# Patient Record
Sex: Female | Born: 1985 | Race: White | Hispanic: No | Marital: Married | Smoking: Never smoker
Health system: Southern US, Community
[De-identification: ages and names within clinical notes are randomized; demographics above are authoritative.]

## PROBLEM LIST (undated history)

## (undated) DIAGNOSIS — Z789 Other specified health status: Secondary | ICD-10-CM

## (undated) HISTORY — PX: WISDOM TOOTH EXTRACTION: SHX21

## (undated) HISTORY — DX: Other specified health status: Z78.9

---

## 2015-03-30 ENCOUNTER — Encounter: Payer: Self-pay | Admitting: Obstetrics & Gynecology

## 2015-12-28 LAB — OB RESULTS CONSOLE HEPATITIS B SURFACE ANTIGEN: HEP B S AG: NEGATIVE

## 2015-12-28 LAB — OB RESULTS CONSOLE HIV ANTIBODY (ROUTINE TESTING): HIV: NONREACTIVE

## 2015-12-28 LAB — OB RESULTS CONSOLE RPR: RPR: NONREACTIVE

## 2015-12-28 LAB — OB RESULTS CONSOLE GC/CHLAMYDIA
Chlamydia: NEGATIVE
GC PROBE AMP, GENITAL: NEGATIVE

## 2015-12-28 LAB — OB RESULTS CONSOLE RUBELLA ANTIBODY, IGM: RUBELLA: IMMUNE

## 2016-05-05 LAB — OB RESULTS CONSOLE RPR: RPR: NONREACTIVE

## 2016-05-05 LAB — OB RESULTS CONSOLE HIV ANTIBODY (ROUTINE TESTING): HIV: NONREACTIVE

## 2016-07-12 LAB — OB RESULTS CONSOLE GBS: STREP GROUP B AG: POSITIVE

## 2016-08-16 ENCOUNTER — Inpatient Hospital Stay (HOSPITAL_COMMUNITY): Payer: PRIVATE HEALTH INSURANCE | Admitting: Anesthesiology

## 2016-08-16 ENCOUNTER — Encounter (HOSPITAL_COMMUNITY): Payer: Self-pay

## 2016-08-16 ENCOUNTER — Inpatient Hospital Stay (HOSPITAL_COMMUNITY)
Admission: AD | Admit: 2016-08-16 | Discharge: 2016-08-18 | DRG: 765 | Disposition: A | Discharge status: Home / Self Care | Payer: PRIVATE HEALTH INSURANCE | Source: Ambulatory Visit | Attending: Obstetrics and Gynecology | Admitting: Obstetrics and Gynecology

## 2016-08-16 ENCOUNTER — Encounter (HOSPITAL_COMMUNITY)
Admission: AD | Disposition: A | Discharge status: Home / Self Care | Payer: Self-pay | Source: Ambulatory Visit | Attending: Obstetrics and Gynecology

## 2016-08-16 DIAGNOSIS — Z3A41 41 weeks gestation of pregnancy: Secondary | ICD-10-CM

## 2016-08-16 DIAGNOSIS — O480 Post-term pregnancy: Principal | ICD-10-CM | POA: Diagnosis present

## 2016-08-16 DIAGNOSIS — Z98891 History of uterine scar from previous surgery: Secondary | ICD-10-CM | POA: Diagnosis not present

## 2016-08-16 DIAGNOSIS — D62 Acute posthemorrhagic anemia: Secondary | ICD-10-CM | POA: Diagnosis not present

## 2016-08-16 DIAGNOSIS — O9081 Anemia of the puerperium: Secondary | ICD-10-CM | POA: Diagnosis not present

## 2016-08-16 DIAGNOSIS — K219 Gastro-esophageal reflux disease without esophagitis: Secondary | ICD-10-CM | POA: Diagnosis present

## 2016-08-16 DIAGNOSIS — O99824 Streptococcus B carrier state complicating childbirth: Secondary | ICD-10-CM | POA: Diagnosis present

## 2016-08-16 DIAGNOSIS — O9962 Diseases of the digestive system complicating childbirth: Secondary | ICD-10-CM | POA: Diagnosis present

## 2016-08-16 DIAGNOSIS — Z6834 Body mass index (BMI) 34.0-34.9, adult: Secondary | ICD-10-CM

## 2016-08-16 DIAGNOSIS — E669 Obesity, unspecified: Secondary | ICD-10-CM | POA: Diagnosis present

## 2016-08-16 DIAGNOSIS — O99214 Obesity complicating childbirth: Secondary | ICD-10-CM | POA: Diagnosis present

## 2016-08-16 LAB — CBC
HEMATOCRIT: 35.5 % — AB (ref 36.0–46.0)
HEMOGLOBIN: 12.3 g/dL (ref 12.0–15.0)
MCH: 30.8 pg (ref 26.0–34.0)
MCHC: 34.6 g/dL (ref 30.0–36.0)
MCV: 88.8 fL (ref 78.0–100.0)
Platelets: 211 10*3/uL (ref 150–400)
RBC: 4 MIL/uL (ref 3.87–5.11)
RDW: 14.8 % (ref 11.5–15.5)
WBC: 16 10*3/uL — AB (ref 4.0–10.5)

## 2016-08-16 LAB — TYPE AND SCREEN
ABO/RH(D): O POS
Antibody Screen: NEGATIVE

## 2016-08-16 SURGERY — Surgical Case
Anesthesia: Regional

## 2016-08-16 SURGERY — Surgical Case
Anesthesia: Epidural

## 2016-08-16 MED ORDER — OXYCODONE-ACETAMINOPHEN 5-325 MG PO TABS: 1.0000 | ORAL_TABLET | ORAL | Status: DC | PRN

## 2016-08-16 MED ORDER — MEPERIDINE HCL 25 MG/ML IJ SOLN
INTRAMUSCULAR | Status: AC
  Filled 2016-08-16: qty 1

## 2016-08-16 MED ORDER — PHENYLEPHRINE HCL 10 MG/ML IJ SOLN
INTRAMUSCULAR | Status: DC | PRN
  Administered 2016-08-16: 80 ug via INTRAVENOUS
  Administered 2016-08-16: 120 ug via INTRAVENOUS
  Administered 2016-08-16 (×3): 80 ug via INTRAVENOUS
  Administered 2016-08-16: 40 ug via INTRAVENOUS
  Administered 2016-08-16: 80 ug via INTRAVENOUS
  Administered 2016-08-16: 40 ug via INTRAVENOUS

## 2016-08-16 MED ORDER — BUPIVACAINE HCL (PF) 0.25 % IJ SOLN
INTRAMUSCULAR | Status: AC
  Filled 2016-08-16: qty 10

## 2016-08-16 MED ORDER — ONDANSETRON HCL 4 MG/2ML IJ SOLN
INTRAMUSCULAR | Status: DC | PRN
  Administered 2016-08-16: 4 mg via INTRAVENOUS

## 2016-08-16 MED ORDER — LIDOCAINE HCL (PF) 1 % IJ SOLN: 30.0000 mL | INTRAMUSCULAR | Status: DC | PRN

## 2016-08-16 MED ORDER — ACETAMINOPHEN 325 MG PO TABS: 650.0000 mg | ORAL_TABLET | ORAL | Status: DC | PRN

## 2016-08-16 MED ORDER — ONDANSETRON HCL 4 MG/2ML IJ SOLN: 4.0000 mg | Freq: Four times a day (QID) | INTRAMUSCULAR | Status: DC | PRN

## 2016-08-16 MED ORDER — LIDOCAINE-EPINEPHRINE (PF) 2 %-1:200000 IJ SOLN
INTRAMUSCULAR | Status: DC | PRN
  Administered 2016-08-16: 5 mL via INTRADERMAL

## 2016-08-16 MED ORDER — OXYTOCIN 40 UNITS IN LACTATED RINGERS INFUSION - SIMPLE MED: 2.5000 [IU]/h | INTRAVENOUS | Status: DC

## 2016-08-16 MED ORDER — FENTANYL 2.5 MCG/ML BUPIVACAINE 1/10 % EPIDURAL INFUSION (WH - ANES)
INTRAMUSCULAR | Status: AC
  Filled 2016-08-16: qty 100

## 2016-08-16 MED ORDER — OXYTOCIN BOLUS FROM INFUSION: 500.0000 mL | Freq: Once | INTRAVENOUS | Status: DC

## 2016-08-16 MED ORDER — TERBUTALINE SULFATE 1 MG/ML IJ SOLN: 0.2500 mg | Freq: Once | INTRAMUSCULAR | Status: DC | PRN

## 2016-08-16 MED ORDER — PHENYLEPHRINE 40 MCG/ML (10ML) SYRINGE FOR IV PUSH (FOR BLOOD PRESSURE SUPPORT): 80.0000 ug | PREFILLED_SYRINGE | INTRAVENOUS | Status: DC | PRN

## 2016-08-16 MED ORDER — SOD CITRATE-CITRIC ACID 500-334 MG/5ML PO SOLN
30.0000 mL | ORAL | Status: DC | PRN
  Administered 2016-08-16 (×2): 30 mL via ORAL
  Filled 2016-08-16 (×2): qty 15

## 2016-08-16 MED ORDER — LACTATED RINGERS IV SOLN
INTRAVENOUS | Status: DC
  Administered 2016-08-16 (×2): via INTRAVENOUS

## 2016-08-16 MED ORDER — MEPERIDINE HCL 25 MG/ML IJ SOLN
INTRAMUSCULAR | Status: DC | PRN
  Administered 2016-08-16 (×2): 12.5 mg via INTRAVENOUS

## 2016-08-16 MED ORDER — SODIUM BICARBONATE 8.4 % IV SOLN
INTRAVENOUS | Status: DC | PRN
  Administered 2016-08-16 (×2): 5 mL via EPIDURAL

## 2016-08-16 MED ORDER — BUPIVACAINE HCL (PF) 0.25 % IJ SOLN
INTRAMUSCULAR | Status: DC | PRN
  Administered 2016-08-16: 10 mL

## 2016-08-16 MED ORDER — LACTATED RINGERS IV SOLN
INTRAVENOUS | Status: DC | PRN
  Administered 2016-08-16 (×2): via INTRAVENOUS

## 2016-08-16 MED ORDER — EPHEDRINE 5 MG/ML INJ: 10.0000 mg | INTRAVENOUS | Status: DC | PRN

## 2016-08-16 MED ORDER — FAMOTIDINE IN NACL 20-0.9 MG/50ML-% IV SOLN
20.0000 mg | Freq: Two times a day (BID) | INTRAVENOUS | Status: DC
  Administered 2016-08-16: 20 mg via INTRAVENOUS
  Filled 2016-08-16 (×2): qty 50

## 2016-08-16 MED ORDER — DIPHENHYDRAMINE HCL 50 MG/ML IJ SOLN
INTRAMUSCULAR | Status: DC | PRN
  Administered 2016-08-16: 25 mg via INTRAVENOUS

## 2016-08-16 MED ORDER — FENTANYL 2.5 MCG/ML BUPIVACAINE 1/10 % EPIDURAL INFUSION (WH - ANES)
14.0000 mL/h | INTRAMUSCULAR | Status: DC | PRN
  Administered 2016-08-16: 14 mL/h via EPIDURAL

## 2016-08-16 MED ORDER — PENICILLIN G POT IN DEXTROSE 60000 UNIT/ML IV SOLN
3.0000 10*6.[IU] | INTRAVENOUS | Status: DC
  Administered 2016-08-16: 3 10*6.[IU] via INTRAVENOUS
  Filled 2016-08-16 (×2): qty 50

## 2016-08-16 MED ORDER — DEXAMETHASONE SODIUM PHOSPHATE 4 MG/ML IJ SOLN
INTRAMUSCULAR | Status: DC | PRN
  Administered 2016-08-16: 4 mg via INTRAVENOUS

## 2016-08-16 MED ORDER — SCOPOLAMINE 1 MG/3DAYS TD PT72
MEDICATED_PATCH | TRANSDERMAL | Status: DC | PRN
  Administered 2016-08-16: 1 via TRANSDERMAL

## 2016-08-16 MED ORDER — CEFAZOLIN SODIUM-DEXTROSE 2-3 GM-% IV SOLR
INTRAVENOUS | Status: DC | PRN
  Administered 2016-08-16: 2 g via INTRAVENOUS

## 2016-08-16 MED ORDER — OXYTOCIN 10 UNIT/ML IJ SOLN
INTRAVENOUS | Status: DC | PRN
  Administered 2016-08-16: 40 [IU] via INTRAVENOUS

## 2016-08-16 MED ORDER — LACTATED RINGERS IV SOLN: 500.0000 mL | INTRAVENOUS | Status: DC | PRN

## 2016-08-16 MED ORDER — OXYTOCIN 40 UNITS IN LACTATED RINGERS INFUSION - SIMPLE MED
1.0000 m[IU]/min | INTRAVENOUS | Status: DC
  Administered 2016-08-16: 2 m[IU]/min via INTRAVENOUS
  Filled 2016-08-16: qty 1000

## 2016-08-16 MED ORDER — PENICILLIN G POTASSIUM 5000000 UNITS IJ SOLR
5.0000 10*6.[IU] | Freq: Once | INTRAMUSCULAR | Status: AC
  Administered 2016-08-16: 5 10*6.[IU] via INTRAVENOUS
  Filled 2016-08-16: qty 5

## 2016-08-16 MED ORDER — LACTATED RINGERS IV SOLN: 500.0000 mL | Freq: Once | INTRAVENOUS | Status: DC

## 2016-08-16 MED ORDER — LIDOCAINE HCL (PF) 1 % IJ SOLN
INTRAMUSCULAR | Status: DC | PRN
  Administered 2016-08-16 (×2): 7 mL via EPIDURAL

## 2016-08-16 MED ORDER — MORPHINE SULFATE (PF) 0.5 MG/ML IJ SOLN
INTRAMUSCULAR | Status: AC
  Filled 2016-08-16: qty 10

## 2016-08-16 MED ORDER — PHENYLEPHRINE 40 MCG/ML (10ML) SYRINGE FOR IV PUSH (FOR BLOOD PRESSURE SUPPORT)
PREFILLED_SYRINGE | INTRAVENOUS | Status: AC
  Filled 2016-08-16: qty 20

## 2016-08-16 MED ORDER — DIPHENHYDRAMINE HCL 50 MG/ML IJ SOLN: 12.5000 mg | INTRAMUSCULAR | Status: DC | PRN

## 2016-08-16 SURGICAL SUPPLY — 36 items
CHLORAPREP W/TINT 26ML (MISCELLANEOUS) ×3 IMPLANT
CLAMP CORD UMBIL (MISCELLANEOUS) IMPLANT
CLOTH BEACON ORANGE TIMEOUT ST (SAFETY) ×3 IMPLANT
CONTAINER PREFILL 10% NBF 15ML (MISCELLANEOUS) IMPLANT
DECANTER SPIKE VIAL GLASS SM (MISCELLANEOUS) ×3 IMPLANT
DERMABOND ADVANCED (GAUZE/BANDAGES/DRESSINGS) ×2
DERMABOND ADVANCED .7 DNX12 (GAUZE/BANDAGES/DRESSINGS) ×1 IMPLANT
DRSG OPSITE POSTOP 4X10 (GAUZE/BANDAGES/DRESSINGS) ×3 IMPLANT
ELECT REM PT RETURN 9FT ADLT (ELECTROSURGICAL) ×3
ELECTRODE REM PT RTRN 9FT ADLT (ELECTROSURGICAL) ×1 IMPLANT
EXTRACTOR VACUUM M CUP 4 TUBE (SUCTIONS) IMPLANT
EXTRACTOR VACUUM M CUP 4' TUBE (SUCTIONS)
GLOVE BIO SURGEON STRL SZ7.5 (GLOVE) ×3 IMPLANT
GLOVE BIOGEL PI IND STRL 7.0 (GLOVE) ×1 IMPLANT
GLOVE BIOGEL PI INDICATOR 7.0 (GLOVE) ×2
GOWN STRL REUS W/TWL LRG LVL3 (GOWN DISPOSABLE) ×6 IMPLANT
KIT ABG SYR 3ML LUER SLIP (SYRINGE) IMPLANT
NEEDLE HYPO 22GX1.5 SAFETY (NEEDLE) ×3 IMPLANT
NEEDLE HYPO 25X5/8 SAFETYGLIDE (NEEDLE) IMPLANT
NEEDLE SPNL 20GX3.5 QUINCKE YW (NEEDLE) IMPLANT
NS IRRIG 1000ML POUR BTL (IV SOLUTION) ×3 IMPLANT
PACK C SECTION WH (CUSTOM PROCEDURE TRAY) ×3 IMPLANT
PENCIL SMOKE EVAC W/HOLSTER (ELECTROSURGICAL) ×3 IMPLANT
SUT MNCRL 0 VIOLET CTX 36 (SUTURE) ×2 IMPLANT
SUT MNCRL AB 3-0 PS2 27 (SUTURE) IMPLANT
SUT MON AB 2-0 CT1 27 (SUTURE) ×3 IMPLANT
SUT MON AB-0 CT1 36 (SUTURE) ×6 IMPLANT
SUT MONOCRYL 0 CTX 36 (SUTURE) ×4
SUT PLAIN 0 NONE (SUTURE) IMPLANT
SUT PLAIN 2 0 (SUTURE)
SUT PLAIN 2 0 XLH (SUTURE) IMPLANT
SUT PLAIN ABS 2-0 CT1 27XMFL (SUTURE) IMPLANT
SYR 20CC LL (SYRINGE) IMPLANT
SYR CONTROL 10ML LL (SYRINGE) ×3 IMPLANT
TOWEL OR 17X24 6PK STRL BLUE (TOWEL DISPOSABLE) ×3 IMPLANT
TRAY FOLEY BAG SILVER LF 14FR (SET/KITS/TRAYS/PACK) ×3 IMPLANT

## 2016-08-16 NOTE — Consult Note (Signed)
Neonatology Note:   Attendance at C-section:    I was asked by Dr. Taavon to attend this primary C/S at 41 3/7 weeks due to arrested descent. The mother is a G1P0 O pos, GBS positive with prolonged second stage of labor, otherwise normal pregnancy. ROM 4 hours prior to delivery, fluid clear. Mother was treated with Pen G > 4 hours before delivery and was afebrile. Infant vigorous with good spontaneous cry and tone. Delayed cord clamping was done. Needed only minimal bulb suctioning. Ap 9/9. Lungs clear to ausc in DR. To CN to care of Pediatrician.   Anaka Beazer C. Kynsleigh Westendorf, MD 

## 2016-08-16 NOTE — Progress Notes (Signed)
S:  pressure with ctx  O:  VS: Blood pressure 111/69, pulse 95, temperature 98.3 F (36.8 C), temperature source Oral, resp. rate 18, height 5\' 1"  (1.549 m), weight 81.6 kg (180 lb), SpO2 97 %.        FHR : baseline 130 / variability moderate / accelerations + / no decelerations        Toco: contractions every 2-3 minutes / moderate / pitocin 12 mu/min        Cervix : swollen and edematous with loss of dilation down to 4cm / vtx 0 with caput in ROT        Membranes: clear  A: arrest of labor     FHR category 1  P: discussed with client and family - arrest of decent affecting cervical dilation and progression to SVD       need for CS delivery      review risk for surgery including injury to nearby structures, bleeding, infection      anticipatory guidance for immediate recovery and home care      Dr Billy Coastaavon updated - called for CS delivery    Marlinda MikeBAILEY, Zyrion Coey CNM, MSN, [facility] 08/16/2016, 10:22 PM

## 2016-08-16 NOTE — Progress Notes (Signed)
S:  Rested after epidural - no pain or pressure  O:  VS: Blood pressure 115/69, pulse 95, temperature 98.6 F (37 C), temperature source Oral, resp. rate 18, height 5\' 1"  (1.549 m), weight 81.6 kg (180 lb), SpO2 97 %.        FHR : baseline 135 / variability moderate / accelerations + / no decelerations        Toco: contractions every 2-3 minutes / moderate / pitocin to 12 mu/min        Cervix : 7cm / 90% / vtx ROT 0 station        Membranes: BBOW - AROM with clear fluid drainage and bloody show        PCN # 2nd dose  A: active labor     FHR category 1  P: active management     recheck 2 hours for progression     Dr Billy Coastaavon updated with status   Amanda Foley, Amanda Foley CNM, MSN, [facility] 08/16/2016, 7:52 PM

## 2016-08-16 NOTE — Op Note (Signed)
Cesarean Section Procedure Note  Indications: failure to progress: arrest of dilation  Pre-operative Diagnosis: 40 week 6 day pregnancy.  Post-operative Diagnosis: same  Surgeon: Lenoard AdenAAVON,Casandra Dallaire J   Assistants: Fredric MareBailey, CNM  Anesthesia: Epidural anesthesia and Local anesthesia 0.25.% bupivacaine  ASA Class: 2  Procedure Details  The patient was seen in the Holding Room. The risks, benefits, complications, treatment options, and expected outcomes were discussed with the patient.  The patient concurred with the proposed plan, giving informed consent. The risks of anesthesia, infection, bleeding and possible injury to other organs discussed. Injury to bowel, bladder, or ureter with possible need for repair discussed. Possible need for transfusion with secondary risks of hepatitis or HIV acquisition discussed. Post operative complications to include but not limited to DVT, PE and Pneumonia noted. The site of surgery properly noted/marked. The patient was taken to Operating Room # 9, identified as Amanda Foley and the procedure verified as C-Section Delivery. A Time Out was held and the above information confirmed.  After induction of anesthesia, the patient was draped and prepped in the usual sterile manner. A Pfannenstiel incision was made and carried down through the subcutaneous tissue to the fascia. Fascial incision was made and extended transversely using Mayo scissors. The fascia was separated from the underlying rectus tissue superiorly and inferiorly. The peritoneum was identified and entered. Peritoneal incision was extended longitudinally. The utero-vesical peritoneal reflection was incised transversely and the bladder flap was bluntly freed from the lower uterine segment. A low transverse uterine incision(Kerr hysterotomy) was made. Delivered from ROT presentation was a  female with Apgar scores of 8 at one minute and 9 at five minutes. Bulb suctioning gently performed. Neonatal team in  attendance.After the umbilical cord was clamped and cut cord blood was obtained for evaluation. The placenta was removed intact and appeared normal. The uterus was curetted with a dry lap pack. Good hemostasis was noted.The uterine outline, tubes and ovaries appeared normal. The uterine incision was closed with running locked sutures of 0 Monocryl x 2 layers. Hemostasis was observed. 3 interrupted sutures for hemostasis.The parietal peritoneum was closed with a running 2-0 Monocryl suture. The fascia was then reapproximated with running sutures of 0 Monocryl. The skin was reapproximated with 3-0 monocryl after [place] closure with 2-0 plain.  Instrument, sponge, and needle counts were correct prior the abdominal closure and at the conclusion of the case.   Findings: FTLF, OT, anterior placenta  Estimated Blood Loss:  800         Drains: foley                 Specimens: placenta                 Complications:  None; patient tolerated the procedure well.         Disposition: 800         Condition: stable  Attending Attestation: I performed the procedure.

## 2016-08-16 NOTE — Anesthesia Preprocedure Evaluation (Addendum)
Anesthesia Evaluation  Patient identified by MRN, date of birth, ID band Patient awake    Reviewed: Allergy & Precautions, H&P , NPO status , Patient's Chart, lab work & pertinent test results  Airway Mallampati: II  TM Distance: >3 FB Neck ROM: full    Dental no notable dental hx. (+) Teeth Intact   Pulmonary neg pulmonary ROS,    Pulmonary exam normal breath sounds clear to auscultation       Cardiovascular negative cardio ROS Normal cardiovascular exam Rhythm:Regular Rate:Normal     Neuro/Psych negative neurological ROS  negative psych ROS   GI/Hepatic Neg liver ROS, GERD  Medicated and Controlled,  Endo/Other  Obesity  Renal/GU negative Renal ROS  negative genitourinary   Musculoskeletal negative musculoskeletal ROS (+)   Abdominal (+) + obese,   Peds  Hematology negative hematology ROS (+)   Anesthesia Other Findings   Reproductive/Obstetrics (+) Pregnancy                            Anesthesia Physical Anesthesia Plan  ASA: II and emergent  Anesthesia Plan: Epidural   Post-op Pain Management:    Induction:   PONV Risk Score and Plan: 3 and Ondansetron, Dexamethasone, Scopolamine patch - Pre-op and Treatment may vary due to age or medical condition  Airway Management Planned:   Additional Equipment:   Intra-op Plan:   Post-operative Plan:   Informed Consent: I have reviewed the patients History and Physical, chart, labs and discussed the procedure including the risks, benefits and alternatives for the proposed anesthesia with the patient or authorized representative who has indicated his/her understanding and acceptance.     Plan Discussed with: Anesthesiologist, CRNA and Surgeon  Anesthesia Plan Comments: (C/Section for failure to progress. Will use epidural.)       Anesthesia Quick Evaluation

## 2016-08-16 NOTE — Anesthesia Procedure Notes (Signed)
Epidural Patient location during procedure: OB Start time: 08/16/2016 3:43 PM End time: 08/16/2016 3:47 PM  Staffing Anesthesiologist: Leilani AbleHATCHETT, Rosamary Boudreau Performed: anesthesiologist   Preanesthetic Checklist Completed: patient identified, surgical consent, pre-op evaluation, timeout performed, IV checked, risks and benefits discussed and monitors and equipment checked  Epidural Patient position: sitting Prep: site prepped and draped and DuraPrep Patient monitoring: continuous pulse ox and blood pressure Approach: midline Location: L3-L4 Injection technique: LOR air  Needle:  Needle type: Tuohy  Needle gauge: 17 G Needle length: 9 cm and 9 Needle insertion depth: 5 cm cm Catheter type: closed end flexible Catheter size: 19 Gauge Catheter at skin depth: 10 cm Test dose: negative and Other  Assessment Sensory level: T9 Events: blood not aspirated, injection not painful, no injection resistance, negative IV test and no paresthesia

## 2016-08-16 NOTE — H&P (Addendum)
OB ADMISSION/ HISTORY & PHYSICAL:  Admission Date: 08/16/2016  2:27 PM  Admit Diagnosis: 41.3 weeks / prolonged latent labor / maternal exhaustion  Amanda Foley is a 31 y.o. female presenting for epidural anesthesia and pitocin augmentation.  Prenatal History: G1P0   EDC : 08/06/2016 by LMP and [facility]  Primary Ob Provider: Marlinda Mikeanya Bailey CNM Prenatal course complicated by / positive GBS carrier / post-dates @ 41 weeks  Prenatal Labs: ABO, Rh: --/--/O POS (07/10 1555) Antibody: NEG (07/10 1555) Rubella: Immune (11/20 0000)  RPR: Nonreactive (03/29 0000)  HBsAg: Negative (11/20 0000)  HIV: Non-reactive (03/29 0000)  GTT: 2hr GTT passed ([phone] with single elevation) GBS: POSITIVE  Medical / Surgical History :  Past medical history: History reviewed. No pertinent past medical history.   Past surgical history:  Past Surgical History:  Procedure Laterality Date  . WISDOM TOOTH EXTRACTION      Family History: History reviewed. No pertinent family history.   Social History:  reports that she has never smoked. She has never used smokeless tobacco. She reports that she does not drink alcohol or use drugs.  Allergies: Almond (diagnostic); Gluten meal; and Lactose intolerance (gi)   Current Medications at time of admission:  Prior to Admission medications   Medication Sig Start Date End Date Taking? Authorizing Provider  calcium carbonate (TUMS - DOSED IN MG ELEMENTAL CALCIUM) 500 MG chewable tablet Chew 1 tablet by mouth daily.   Yes [provider]  MAGNESIUM PO Take 1 tablet by mouth daily.   Yes [provider]  Prenatal Vit-Fe Fumarate-FA (PRENATAL MULTIVITAMIN) TABS tablet Take 1 tablet by mouth daily at 12 noon.   Yes [provider]   Review of Systems: active FM intense back labor with OP presentation unrelieved with supportive options - pain management needed onset of ctx @ 1900 7/9 with painful ctx at  midnight / [facility] no progressive cervical change with prolonged latent / prodromal labor pattern no LOF / membranes intact bloody show - pink discharge present no sleep > 30 hours recurrent nausea and vomiting with GERD - mild dehydration [facility]  Physical Exam: VS: Blood pressure 115/69, pulse 95, temperature 98.6 F (37 C), temperature source Oral, resp. rate 18, height 5\' 1"  (1.549 m), weight 81.6 kg (180 lb), SpO2 97 %.  General: alert and oriented, appears fatigued Heart: RRR Lungs: Clear lung fields Abdomen: Gravid, soft and non-tender, non-distended / uterus: non-tender & gravid with EFW 8.5-9 pounds Extremities: 1+ pedal edema  Genitalia / VE: 3.5cm / 90% / vtx -1 / intact membranes FHR: baseline rate 135 / variability moderate / accelerations + / no decelerations TOCO: ctx mild and irregular every 10-12 minutes with coupling  Assessment: 41.[redacted] weeks gestation Prolonged latent stage of labor FHR category 1 GBS positive  Plan:  Admit Epidural and therapeutic rest Pitocin augmentation PCN protocol AROM after establishment of regular ctx  Dr Billy Coastaavon notified of admission / plan of care Agree with plan of care   Marlinda MikeBAILEY, TANYA CNM, MSN, [facility] 08/16/2016 @ 1630

## 2016-08-17 ENCOUNTER — Encounter (HOSPITAL_COMMUNITY): Payer: Self-pay | Admitting: *Deleted

## 2016-08-17 LAB — CBC
HEMATOCRIT: 26.6 % — AB (ref 36.0–46.0)
Hemoglobin: 9.2 g/dL — ABNORMAL LOW (ref 12.0–15.0)
MCH: 30.9 pg (ref 26.0–34.0)
MCHC: 34.6 g/dL (ref 30.0–36.0)
MCV: 89.3 fL (ref 78.0–100.0)
Platelets: 193 10*3/uL (ref 150–400)
RBC: 2.98 MIL/uL — ABNORMAL LOW (ref 3.87–5.11)
RDW: 14.9 % (ref 11.5–15.5)
WBC: 19 10*3/uL — AB (ref 4.0–10.5)

## 2016-08-17 LAB — ABO/RH: ABO/RH(D): O POS

## 2016-08-17 LAB — RPR: RPR Ser Ql: NONREACTIVE

## 2016-08-17 MED ORDER — MENTHOL 3 MG MT LOZG: 1.0000 | LOZENGE | OROMUCOSAL | Status: DC | PRN

## 2016-08-17 MED ORDER — MORPHINE SULFATE (PF) 0.5 MG/ML IJ SOLN
INTRAMUSCULAR | Status: DC | PRN
Start: 2016-08-16 — End: 2016-08-17
  Administered 2016-08-16: 4 mg via EPIDURAL

## 2016-08-17 MED ORDER — SIMETHICONE 80 MG PO CHEW: 80.0000 mg | CHEWABLE_TABLET | ORAL | Status: DC

## 2016-08-17 MED ORDER — MEPERIDINE HCL 25 MG/ML IJ SOLN
INTRAMUSCULAR | Status: AC
  Filled 2016-08-17: qty 1

## 2016-08-17 MED ORDER — DIPHENHYDRAMINE HCL 50 MG/ML IJ SOLN
INTRAMUSCULAR | Status: AC
  Filled 2016-08-17: qty 1

## 2016-08-17 MED ORDER — ZOLPIDEM TARTRATE 5 MG PO TABS
5.0000 mg | ORAL_TABLET | Freq: Every evening | ORAL | Status: DC | PRN
Start: 2016-08-17 — End: 2016-08-17

## 2016-08-17 MED ORDER — KETOROLAC TROMETHAMINE 30 MG/ML IJ SOLN
30.0000 mg | Freq: Once | INTRAMUSCULAR | Status: AC
  Administered 2016-08-17: 30 mg via INTRAMUSCULAR

## 2016-08-17 MED ORDER — DOCUSATE SODIUM 100 MG PO CAPS
100.0000 mg | ORAL_CAPSULE | Freq: Two times a day (BID) | ORAL | Status: DC
  Administered 2016-08-17 – 2016-08-18 (×3): 100 mg via ORAL
  Filled 2016-08-17 (×3): qty 1

## 2016-08-17 MED ORDER — SENNOSIDES-DOCUSATE SODIUM 8.6-50 MG PO TABS: 2.0000 | ORAL_TABLET | ORAL | Status: DC

## 2016-08-17 MED ORDER — DIPHENHYDRAMINE HCL 25 MG PO CAPS: 25.0000 mg | ORAL_CAPSULE | Freq: Four times a day (QID) | ORAL | Status: AC | PRN

## 2016-08-17 MED ORDER — KETOROLAC TROMETHAMINE 30 MG/ML IJ SOLN
INTRAMUSCULAR | Status: AC
  Filled 2016-08-17: qty 1

## 2016-08-17 MED ORDER — DIBUCAINE 1 % RE OINT: 1.0000 "application " | TOPICAL_OINTMENT | RECTAL | Status: DC | PRN

## 2016-08-17 MED ORDER — OXYTOCIN 40 UNITS IN LACTATED RINGERS INFUSION - SIMPLE MED: 2.5000 [IU]/h | INTRAVENOUS | Status: DC

## 2016-08-17 MED ORDER — CEFAZOLIN SODIUM-DEXTROSE 2-4 GM/100ML-% IV SOLN
INTRAVENOUS | Status: AC
  Filled 2016-08-17: qty 100

## 2016-08-17 MED ORDER — IBUPROFEN 800 MG PO TABS
800.0000 mg | ORAL_TABLET | Freq: Three times a day (TID) | ORAL | Status: DC
  Administered 2016-08-17 – 2016-08-18 (×4): 800 mg via ORAL
  Filled 2016-08-17 (×4): qty 1

## 2016-08-17 MED ORDER — PRENATAL MULTIVITAMIN CH
1.0000 | ORAL_TABLET | Freq: Every day | ORAL | Status: DC
  Filled 2016-08-17 (×3): qty 1

## 2016-08-17 MED ORDER — OXYCODONE-ACETAMINOPHEN 5-325 MG PO TABS: 1.0000 | ORAL_TABLET | ORAL | Status: DC | PRN

## 2016-08-17 MED ORDER — SIMETHICONE 80 MG PO CHEW
80.0000 mg | CHEWABLE_TABLET | Freq: Three times a day (TID) | ORAL | Status: DC
  Administered 2016-08-17: 80 mg via ORAL
  Filled 2016-08-17 (×3): qty 1

## 2016-08-17 MED ORDER — DEXAMETHASONE SODIUM PHOSPHATE 4 MG/ML IJ SOLN
INTRAMUSCULAR | Status: AC
  Filled 2016-08-17: qty 1

## 2016-08-17 MED ORDER — SCOPOLAMINE 1 MG/3DAYS TD PT72
MEDICATED_PATCH | TRANSDERMAL | Status: AC
Start: 2016-08-17 — End: 2016-08-17
  Filled 2016-08-17: qty 1

## 2016-08-17 MED ORDER — METHYLERGONOVINE MALEATE 0.2 MG PO TABS: 0.2000 mg | ORAL_TABLET | ORAL | Status: DC | PRN

## 2016-08-17 MED ORDER — METHYLERGONOVINE MALEATE 0.2 MG/ML IJ SOLN: 0.2000 mg | INTRAMUSCULAR | Status: DC | PRN

## 2016-08-17 MED ORDER — SODIUM CHLORIDE 0.9 % IJ SOLN
INTRAMUSCULAR | Status: AC
  Filled 2016-08-17: qty 20

## 2016-08-17 MED ORDER — COCONUT OIL OIL: 1.0000 "application " | TOPICAL_OIL | Status: DC | PRN

## 2016-08-17 MED ORDER — WITCH HAZEL-GLYCERIN EX PADS: 1.0000 "application " | MEDICATED_PAD | CUTANEOUS | Status: DC | PRN

## 2016-08-17 MED ORDER — SIMETHICONE 80 MG PO CHEW: 80.0000 mg | CHEWABLE_TABLET | ORAL | Status: DC | PRN

## 2016-08-17 MED ORDER — FENTANYL CITRATE (PF) 100 MCG/2ML IJ SOLN: 25.0000 ug | INTRAMUSCULAR | Status: DC | PRN

## 2016-08-17 MED ORDER — OXYCODONE-ACETAMINOPHEN 5-325 MG PO TABS: 2.0000 | ORAL_TABLET | ORAL | Status: DC | PRN

## 2016-08-17 MED ORDER — POLYSACCHARIDE IRON COMPLEX 150 MG PO CAPS
150.0000 mg | ORAL_CAPSULE | Freq: Every day | ORAL | Status: DC
  Administered 2016-08-18: 150 mg via ORAL
  Filled 2016-08-17: qty 1

## 2016-08-17 MED ORDER — ACETAMINOPHEN 325 MG PO TABS
650.0000 mg | ORAL_TABLET | ORAL | Status: DC | PRN
  Filled 2016-08-17: qty 2

## 2016-08-17 MED ORDER — TETANUS-DIPHTH-ACELL PERTUSSIS 5-2.5-18.5 LF-MCG/0.5 IM SUSP: 0.5000 mL | Freq: Once | INTRAMUSCULAR | Status: DC

## 2016-08-17 MED ORDER — LACTATED RINGERS IV SOLN
INTRAVENOUS | Status: DC
  Administered 2016-08-17: 04:00:00 via INTRAVENOUS

## 2016-08-17 MED ORDER — MAGNESIUM OXIDE 400 (241.3 MG) MG PO TABS
400.0000 mg | ORAL_TABLET | Freq: Every day | ORAL | Status: DC
  Administered 2016-08-17 – 2016-08-18 (×2): 400 mg via ORAL
  Filled 2016-08-17 (×3): qty 1

## 2016-08-17 MED ORDER — IBUPROFEN 600 MG PO TABS
600.0000 mg | ORAL_TABLET | Freq: Four times a day (QID) | ORAL | Status: DC
  Administered 2016-08-17: 600 mg via ORAL
  Filled 2016-08-17: qty 1

## 2016-08-17 NOTE — Transfer of Care (Signed)
Immediate Anesthesia Transfer of Care Note  Patient: Amanda Foley  Procedure(s) Performed: Procedure(s): CESAREAN SECTION (N/A)  Patient Location: PACU  Anesthesia Type:Epidural  Level of Consciousness: awake, alert  and oriented  Airway & Oxygen Therapy: Patient Spontanous Breathing  Post-op Assessment: Report given to RN  Post vital signs: Reviewed and stable  Last Vitals:  Vitals:   08/16/16 2246 08/17/16 0005  BP: 119/81 (!) 110/51  Pulse: (!) 112   Resp:  13  Temp:      Last Pain:  Vitals:   08/16/16 2230  TempSrc: Axillary  PainSc: 0-No pain         Complications: No apparent anesthesia complications

## 2016-08-17 NOTE — Anesthesia Postprocedure Evaluation (Signed)
Anesthesia Post Note  Patient: Amanda Foley  Procedure(s) Performed: Procedure(s) (LRB): CESAREAN SECTION (N/A)     Patient location during evaluation: PACU Anesthesia Type: Epidural Level of consciousness: awake and alert and oriented Pain management: pain level controlled Vital Signs Assessment: post-procedure vital signs reviewed and stable Respiratory status: spontaneous breathing, nonlabored ventilation and respiratory function stable Cardiovascular status: stable Postop Assessment: no headache, no backache, epidural receding, no signs of nausea or vomiting and patient able to bend at knees Anesthetic complications: no    Last Vitals:  Vitals:   08/17/16 0030 08/17/16 0045  BP: 112/77 112/70  Pulse: (!) 102 (!) 105  Resp: 16 19  Temp: 36.8 C     Last Pain:  Vitals:   08/17/16 0045  TempSrc:   PainSc: 0-No pain   Pain Goal:                 Hilton Saephan A.

## 2016-08-17 NOTE — Anesthesia Postprocedure Evaluation (Signed)
Anesthesia Post Note  Patient: Skylee Valent  Procedure(s) Performed: Procedure(s) (LRB): CESAREAN SECTION (N/A)     Patient location during evaluation: Mother Baby Anesthesia Type: Epidural Level of consciousness: awake and alert and oriented Pain management: pain level controlled Vital Signs Assessment: post-procedure vital signs reviewed and stable Respiratory status: spontaneous breathing and nonlabored ventilation Cardiovascular status: stable Postop Assessment: no headache, no backache, patient able to bend at knees, no signs of nausea or vomiting and adequate PO intake Anesthetic complications: no    Last Vitals:  Vitals:   08/17/16 0334 08/17/16 0423  BP: 122/72 117/74  Pulse: 81 79  Resp: 16 16  Temp: 36.6 C 37.3 C    Last Pain:  Vitals:   08/17/16 0529  TempSrc:   PainSc: 0-No pain   Pain Goal: Patients Stated Pain Goal: 5 (08/17/16 0100)               Madison HickmanGREGORY,Kazia Grisanti

## 2016-08-17 NOTE — Progress Notes (Signed)
UR chart review completed.  

## 2016-08-17 NOTE — Addendum Note (Signed)
Addendum  created 08/17/16 0823 by Shanon PayorGregory, Elainna Eshleman M, CRNA   Sign clinical note

## 2016-08-17 NOTE — Progress Notes (Signed)
[facility] transfer for prolonged latent labor POSTOPERATIVE DAY # 1 S/P CS - active phase arrest  S:         Reports feeling tired but good             Tolerating po intake / no nausea / no vomiting / no flatus / no BM             Bleeding is light             Pain controlled with long-acting narcotic (causing some itching)             Up ad lib / ambulatory/ voiding QS  Newborn - "Feliberto Gottronvelyn Rose" /  breast feeding  O:  VS: BP 117/74 (BP Location: Right Arm)   Pulse 79   Temp 99.2 F (37.3 C) (Oral)   Resp 16   Ht 5\' 1"  (1.549 m)   Wt 81.6 kg (180 lb)   SpO2 96%   BMI 34.01 kg/m    LABS:               Recent Labs  08/16/16 1455 08/17/16 0517  WBC 16.0* 19.0*  HGB 12.3 9.2*  PLT 211 193               Bloodtype: --/--/O POS (07/10 1555)  Rubella: Immune (11/20 0000)                                           I&O: Intake/Output      07/10 0701 - 07/11 0700 07/11 0701 - 07/12 0700   I.V. (mL/kg) 1300 (15.9)    Total Intake(mL/kg) 1300 (15.9)    Urine (mL/kg/hr) 1975    Blood 900    Total Output 2875     Net -1575                     Physical Exam:             Alert and Oriented X3  Lungs: Clear and unlabored  Heart: regular rate and rhythm / no mumurs  Abdomen: soft, non-tender, non-distended              Fundus: firm, non-tender, Ueven             Dressing intact honeycomb              Incision:  approximated with suture / no erythema / no ecchymosis / no drainage  Perineum: intact  Lochia: light  Extremities: 1+ pedal edema, no calf pain or tenderness, SCD in place  A:        POD # 1 S/P CS - active phase arrest            ABL anemia post-op  P:        Routine postoperative care              Start iron and magnesium with colace (hx constipation & treated with Zofran x 3 in labor)             Planning DC home tomorrow   Marlinda MikeBAILEY, Kemauri Musa CNM, MSN, [facility] 08/17/2016, 7:38 AM

## 2016-08-17 NOTE — Lactation Note (Signed)
This note was copied from a baby's chart. Lactation Consultation Note  Patient Name: Girl Morton Petersmy Bruna Today's Date: 08/17/2016 Reason for consult: Initial assessment   Initial assessment with first time mom of 7519 hour old infant. Infant has been feeding well per mom. She is having trouble latching infant at times as infant is fussy. Enc mom to offer infant the breast at first feeding cues before crying begins if possible. Enc mom to offer spoon feeding if infant fussy and will not latch. Mom had infant latched to the right breast when LC entered room. Mom had infant positioned well with good head support. Mom then burped infant and latched her to the left breast independently. Infant fed well with multiple swallows noted. Nipple was round and elongated when infant came off breast. Infant was then swaddled and held by FOB and fell asleep.    Infant is noted to be be very rigid and arching her back. Enc mom to do tummy time with infant 3 x a day for 5 minutes only when infant awake and when parents is with her. Discussed gentle jaw massage prior to and after feedings also.   Mom with soft compressible breasts with short shaft everted nipples. Mom is able to hand express colostrum, she reports she is feeling fuller today. Enc mom to continue to feed infant STS 8-12 x in 24 hours at first feeding cues. Enc mom to massage/compress breast with feeding and to use awakening techniques as needed with feeding.   BF resources Handout and LC Brochure given, mom informed of IP/OP services, BF Support Groups and LC phone #. Enc mom to call out for feeding assistance as needed. Mom and dad with no further questions/concerns at this time.    Maternal Data Formula Feeding for Exclusion: No Has patient been taught Hand Expression?: Yes Does the patient have breastfeeding experience prior to this delivery?: No  Feeding Feeding Type: Breast Fed Length of feed: 20 min  LATCH Score/Interventions Latch: Grasps  breast easily, tongue down, lips flanged, rhythmical sucking. Intervention(s): Adjust position;Assist with latch;Breast massage;Breast compression  Audible Swallowing: Spontaneous and intermittent Intervention(s): Skin to skin;Hand expression;Alternate breast massage  Type of Nipple: Everted at rest and after stimulation (short shaft)  Comfort (Breast/Nipple): Soft / non-tender     Hold (Positioning): No assistance needed to correctly position infant at breast. Intervention(s): Breastfeeding basics reviewed;Support Pillows;Position options;Skin to skin  LATCH Score: 10  Lactation Tools Discussed/Used WIC Program: No   Consult Status Consult Status: Follow-up Date: 08/18/16 Follow-up type: In-patient    Silas FloodSharon S Chasmine Lender 08/17/2016, 6:39 PM

## 2016-08-18 MED ORDER — IBUPROFEN 800 MG PO TABS: 800.0000 mg | ORAL_TABLET | Freq: Three times a day (TID) | ORAL | 0 refills | Status: DC

## 2016-08-18 MED ORDER — DOCUSATE SODIUM 100 MG PO CAPS: 100.0000 mg | ORAL_CAPSULE | Freq: Two times a day (BID) | ORAL | 0 refills | Status: DC

## 2016-08-18 MED ORDER — OXYCODONE-ACETAMINOPHEN 5-325 MG PO TABS: 1.0000 | ORAL_TABLET | ORAL | 0 refills | Status: DC | PRN

## 2016-08-18 MED ORDER — POLYSACCHARIDE IRON COMPLEX 150 MG PO CAPS: 150.0000 mg | ORAL_CAPSULE | Freq: Every day | ORAL | 0 refills | Status: DC

## 2016-08-18 NOTE — Lactation Note (Signed)
This note was copied from a baby's chart. Lactation Consultation Note  Patient Name: Amanda Foley ZOXWR'UToday's Date: 08/18/2016 Reason for consult: Follow-up assessment  Baby 38 hours old. Mom reports that her nipples are sore. Mom requesting NS so given with review and enc to use EBM for comfort and healing. Mom had just latched baby in cross-cradle position when this LC entered the room. Mom reports some discomfort when baby first latches, and states that baby re-latches several times during breastfeed. Baby able to latch deeply with lips flanged, and mom's nipple perfectly round when baby removed from breast. Baby able to extend tongue past lower gum well, however, mom's nipple red/pink and sore after nursing for 10 minutes. Discussed making a Lactation outpatient appointment, and mom states that she will call and make one if soreness continues after her milk comes to volume. Mom aware of BFSG and LC phone line assistance after D/C. Discussed engorgement prevention/treatment as well.   Maternal Data    Feeding Feeding Type: Breast Fed Length of feed:  (LC assessed first 15 minutes of BF. )  LATCH Score/Interventions Latch: Grasps breast easily, tongue down, lips flanged, rhythmical sucking.  Audible Swallowing: A few with stimulation Intervention(s): Skin to skin;Hand expression  Type of Nipple: Everted at rest and after stimulation Intervention(s): No intervention needed  Comfort (Breast/Nipple): Filling, red/small blisters or bruises, mild/mod discomfort  Problem noted: Mild/Moderate discomfort Interventions (Mild/moderate discomfort): Comfort gels  Hold (Positioning): No assistance needed to correctly position infant at breast.  LATCH Score: 8  Lactation Tools Discussed/Used Tools: Comfort gels   Consult Status Consult Status: Complete    Sherlyn HayJennifer D Rayfield Beem 08/18/2016, 2:02 PM

## 2016-08-18 NOTE — Discharge Summary (Signed)
POSTOPERATIVE DISCHARGE SUMMARY:  Patient ID: Amanda Foley MRN: [id number] DOB/AGE: [birth year] 31 y.o.  Admit date: 08/16/2016 Admission Diagnoses: 41.3 prolonged latent labor  Discharge date:   Discharge Diagnoses: POD 2 s/p primary CS for active phase arrest  Prenatal history: G1P1001   EDC : 08/06/2016, by Patient Reported  P[facility]atal care at [facility] Primary provider : Fredric Mare CNM Prenatal course complicated by post-dates / GBS carrier  Prenatal Labs: ABO, Rh: --/--/O POS (07/10 1603) Antibody: NEG (07/10 1555) Rubella: Immune (11/20 0000) RPR: Non Reactive (07/10 1555)  HBsAg: Negative (11/20 0000)  HIV: Non-reactive (03/29 0000)  GBS: Positive (06/05 0000)   Medical / Surgical History :  Past medical history: History reviewed. No pertinent past medical history.  Past surgical history:  Past Surgical History:  Procedure Laterality Date  . CESAREAN SECTION N/A 08/16/2016   Procedure: CESAREAN SECTION;  Surgeon: Olivia Mackie, MD;  Location: [facility] BIRTHING SUITES;  Service: Obstetrics;  Laterality: N/A;  . WISDOM TOOTH EXTRACTION      Family History: History reviewed. No pertinent family history.  Social History:  reports that she has never smoked. She has never used smokeless tobacco. She reports that she does not drink alcohol or use drugs.  Allergies: Almond (diagnostic); Gluten meal; and Lactose intolerance (gi)   Current Medications at time of admission:  Prior to Admission medications   Medication Sig Start Date End Date Taking? Authorizing Provider  calcium carbonate (TUMS - DOSED IN MG ELEMENTAL CALCIUM) 500 MG chewable tablet Chew 1 tablet by mouth daily.   Yes [provider]  MAGNESIUM PO Take 1 tablet by mouth daily.   Yes [provider]  Prenatal Vit-Fe Fumarate-FA (PRENATAL MULTIVITAMIN) TABS tablet Take 1 tablet by mouth daily at 12 noon.   Yes [provider]   Intrapartum Course:  Admit for prolonged latent  labor with labor progression to 7cm dilation with protracted labor curve Pain management: epidural Complicated by: active phase arrest with cervical edema and lack of descent past 0 station Interventions required: c-section delivery  Procedures: Cesarean section delivery on 7/10 with delivery of viable female newborn by Dr Billy Coast   See operative report for further details APGAR (1 MIN): 9   APGAR (5 MINS): 9    Postoperative / postpartum course:  Uncomplicated with discharge on POD 2  Discharge Instructions:  Discharged Condition: stable  Activity: pelvic rest and postoperative restrictions x 2   Diet: routine  Medications:  Allergies as of 08/18/2016      Reactions   Almond (diagnostic)    Gluten Meal Other (See Comments)   Reaction: G. I.   Lactose Intolerance (gi) Other (See Comments)   Reaction: G.I.      Medication List    TAKE these medications   calcium carbonate 500 MG chewable tablet Commonly known as:  TUMS - dosed in mg elemental calcium Chew 1 tablet by mouth daily.   docusate sodium 100 MG capsule Commonly known as:  COLACE Take 1 capsule (100 mg total) by mouth 2 (two) times daily.   ibuprofen 800 MG tablet Commonly known as:  ADVIL,MOTRIN Take 1 tablet (800 mg total) by mouth every 8 (eight) hours.   iron polysaccharides 150 MG capsule Commonly known as:  NIFEREX Take 1 capsule (150 mg total) by mouth daily.   MAGNESIUM PO Take 1 tablet by mouth daily.   oxyCODONE-acetaminophen 5-325 MG tablet Commonly known as:  PERCOCET/ROXICET Take 1 tablet by mouth every 4 (four) hours as needed (  pain scale 4-7).   prenatal multivitamin Tabs tablet Take 1 tablet by mouth daily at 12 noon.       Wound Care: keep clean and dry / home visit this weekend to remove dressing and post-op check Postpartum Instructions: [facility] discharge booklet - instructions reviewed  Discharge to: Home  Follow up :  [facility] in 2 for interval visit with postpartum  visit [facility] in 6 weeks for routine postpartum visit with Marlinda Mikeanya Kikue Gerhart             Signed: Marlinda MikeBAILEY, Alzena Gerber CNM, MSN, [facility] Center[facility]ACNM 08/18/2016, 11:58 AM

## 2016-08-18 NOTE — Progress Notes (Signed)
POSTOPERATIVE DAY # 2 S/P CS   S:         Reports feeling tired but ok             Tolerating po intake / no nausea / no vomiting / + flatus / no BM             Bleeding is light             Pain controlled with motrin and oxycodone             Up ad lib / ambulatory/ voiding QS  Newborn breast feeding    O:  VS: BP 110/67 (BP Location: Right Arm)   Pulse 78   Temp 97.7 F (36.5 C) (Oral)   Resp 16   Ht 5\' 1"  (1.549 m)   Wt 81.6 kg (180 lb)   SpO2 95%   Breastfeeding? Unknown   BMI 34.01 kg/m    LABS:               Recent Labs  08/16/16 1455 08/17/16 0517  WBC 16.0* 19.0*  HGB 12.3 9.2*  PLT 211 193               Bloodtype: --/--/O POS (07/10 1603)  Rubella: Immune (11/20 0000)                                Physical Exam:             Alert and Oriented X3  Lungs: Clear and unlabored  Heart: regular rate and rhythm / no mumurs  Abdomen: soft, non-tender, non-distended              Fundus: firm, non-tender, U-1             Dressing intact              Incision:  approximated with suture / no erythema / no ecchymosis / no drainage  Perineum: intact  Lochia: light  Extremities: 2+ edema, no calf pain or tenderness, negative Homans  A:        POD # 2 S/P CS            ABL anemia  P:        Routine postoperative care              DC home   Marlinda MikeBAILEY, Esly Selvage CNM, MSN, [facility]den[facility]ACNM 08/18/2016, 11:40 AM

## 2018-03-07 ENCOUNTER — Encounter: Payer: Self-pay | Admitting: General Practice

## 2018-03-07 ENCOUNTER — Ambulatory Visit (INDEPENDENT_AMBULATORY_CARE_PROVIDER_SITE_OTHER): Payer: PRIVATE HEALTH INSURANCE | Admitting: *Deleted

## 2018-03-07 VITALS — BP 120/74 | HR 94 | Temp 97.9°F | Ht 62.0 in | Wt 181.4 lb

## 2018-03-07 DIAGNOSIS — Z3200 Encounter for pregnancy test, result unknown: Secondary | ICD-10-CM

## 2018-03-07 DIAGNOSIS — Z3201 Encounter for pregnancy test, result positive: Secondary | ICD-10-CM | POA: Diagnosis not present

## 2018-03-07 LAB — POCT URINE PREGNANCY: PREG TEST UR: POSITIVE — AB

## 2018-03-07 NOTE — Progress Notes (Signed)
   Ms. Barkalow presents today for UPT. She has no unusual complaints and complains of nausea without vomiting for 2 days. LMP: 01/23/2018    OBJECTIVE: Appears well, in no apparent distress.  OB History    Gravida  2   Para  1   Term  1   Preterm      AB      Living  1     SAB      TAB      Ectopic      Multiple  0   Live Births  1          Home UPT Result:Positive In-Office UPT result: Positive I have reviewed the patient's medical, obstetrical, social, and family histories, and medications.   ASSESSMENT: Positive pregnancy test  PLAN Prenatal care to be completed at: [facility]. Offered something for nausea like Zofran. However, per patient Zofran caused constipation. Continue PNV.   Clovis Pu, RN

## 2018-03-12 ENCOUNTER — Encounter: Payer: Self-pay | Admitting: General Practice

## 2018-11-14 ENCOUNTER — Other Ambulatory Visit: Payer: Self-pay

## 2018-11-14 ENCOUNTER — Inpatient Hospital Stay (HOSPITAL_COMMUNITY)
Admission: AD | Admit: 2018-11-14 | Discharge: 2018-11-18 | DRG: 786 | Disposition: A | Discharge status: Home / Self Care | Payer: Self-pay | Attending: Obstetrics & Gynecology | Admitting: Obstetrics & Gynecology

## 2018-11-14 ENCOUNTER — Inpatient Hospital Stay (HOSPITAL_COMMUNITY): Payer: Self-pay | Admitting: Anesthesiology

## 2018-11-14 ENCOUNTER — Encounter (HOSPITAL_COMMUNITY): Payer: Self-pay | Admitting: *Deleted

## 2018-11-14 DIAGNOSIS — Z3A42 42 weeks gestation of pregnancy: Secondary | ICD-10-CM

## 2018-11-14 DIAGNOSIS — O9902 Anemia complicating childbirth: Secondary | ICD-10-CM | POA: Diagnosis present

## 2018-11-14 DIAGNOSIS — O324XX0 Maternal care for high head at term, not applicable or unspecified: Secondary | ICD-10-CM | POA: Diagnosis present

## 2018-11-14 DIAGNOSIS — D649 Anemia, unspecified: Secondary | ICD-10-CM | POA: Diagnosis present

## 2018-11-14 DIAGNOSIS — R140 Abdominal distension (gaseous): Secondary | ICD-10-CM

## 2018-11-14 DIAGNOSIS — Z3A49 Greater than 42 weeks gestation of pregnancy: Secondary | ICD-10-CM

## 2018-11-14 DIAGNOSIS — O480 Post-term pregnancy: Principal | ICD-10-CM | POA: Diagnosis present

## 2018-11-14 DIAGNOSIS — O411030 Infection of amniotic sac and membranes, unspecified, third trimester, not applicable or unspecified: Secondary | ICD-10-CM

## 2018-11-14 DIAGNOSIS — R109 Unspecified abdominal pain: Secondary | ICD-10-CM

## 2018-11-14 DIAGNOSIS — O9963 Diseases of the digestive system complicating the puerperium: Secondary | ICD-10-CM | POA: Diagnosis present

## 2018-11-14 DIAGNOSIS — K9189 Other postprocedural complications and disorders of digestive system: Secondary | ICD-10-CM

## 2018-11-14 DIAGNOSIS — O481 Prolonged pregnancy: Secondary | ICD-10-CM

## 2018-11-14 DIAGNOSIS — Z20828 Contact with and (suspected) exposure to other viral communicable diseases: Secondary | ICD-10-CM | POA: Diagnosis present

## 2018-11-14 DIAGNOSIS — O411230 Chorioamnionitis, third trimester, not applicable or unspecified: Secondary | ICD-10-CM | POA: Diagnosis present

## 2018-11-14 DIAGNOSIS — O34211 Maternal care for low transverse scar from previous cesarean delivery: Secondary | ICD-10-CM | POA: Diagnosis present

## 2018-11-14 DIAGNOSIS — K567 Ileus, unspecified: Secondary | ICD-10-CM | POA: Diagnosis present

## 2018-11-14 LAB — CBC
HCT: 33.3 % — ABNORMAL LOW (ref 36.0–46.0)
Hemoglobin: 10.5 g/dL — ABNORMAL LOW (ref 12.0–15.0)
MCH: 25.4 pg — ABNORMAL LOW (ref 26.0–34.0)
MCHC: 31.5 g/dL (ref 30.0–36.0)
MCV: 80.6 fL (ref 80.0–100.0)
Platelets: 271 K/uL (ref 150–400)
RBC: 4.13 MIL/uL (ref 3.87–5.11)
RDW: 15.9 % — ABNORMAL HIGH (ref 11.5–15.5)
WBC: 14.2 K/uL — ABNORMAL HIGH (ref 4.0–10.5)
nRBC: 0 % (ref 0.0–0.2)

## 2018-11-14 LAB — COMPREHENSIVE METABOLIC PANEL
ALT: 22 U/L (ref 0–44)
AST: 39 U/L (ref 15–41)
Albumin: 2.6 g/dL — ABNORMAL LOW (ref 3.5–5.0)
Alkaline Phosphatase: 159 U/L — ABNORMAL HIGH (ref 38–126)
Anion gap: 15 (ref 5–15)
BUN: 8 mg/dL (ref 6–20)
CO2: 15 mmol/L — ABNORMAL LOW (ref 22–32)
Calcium: 8.7 mg/dL — ABNORMAL LOW (ref 8.9–10.3)
Chloride: 102 mmol/L (ref 98–111)
Creatinine, Ser: 0.72 mg/dL (ref 0.44–1.00)
GFR calc Af Amer: >60 mL/min (ref >60)
GFR calc non Af Amer: >60 mL/min (ref >60)
Glucose, Bld: 147 mg/dL — ABNORMAL HIGH (ref 70–99)
Potassium: 4.1 mmol/L (ref 3.5–5.1)
Sodium: 132 mmol/L — ABNORMAL LOW (ref 135–145)
Total Bilirubin: 0.6 mg/dL (ref 0.3–1.2)
Total Protein: 6.1 g/dL — ABNORMAL LOW (ref 6.5–8.1)

## 2018-11-14 LAB — OB RESULTS CONSOLE ABO/RH: RH Type: POSITIVE

## 2018-11-14 LAB — OB RESULTS CONSOLE RUBELLA ANTIBODY, IGM: Rubella: IMMUNE

## 2018-11-14 LAB — OB RESULTS CONSOLE HEPATITIS B SURFACE ANTIGEN: Hepatitis B Surface Ag: NEGATIVE

## 2018-11-14 LAB — TYPE AND SCREEN
ABO/RH(D): O POS
Antibody Screen: NEGATIVE

## 2018-11-14 LAB — GROUP B STREP BY PCR: Group B strep by PCR: NEGATIVE

## 2018-11-14 LAB — PROTEIN / CREATININE RATIO, URINE
Creatinine, Urine: 192.94 mg/dL
Protein Creatinine Ratio: 0.21 mg/mg{Cre} — ABNORMAL HIGH (ref 0.00–0.15)
Total Protein, Urine: 40 mg/dL

## 2018-11-14 LAB — SARS CORONAVIRUS 2 BY RT PCR (HOSPITAL ORDER, PERFORMED IN ~~LOC~~ HOSPITAL LAB): SARS Coronavirus 2: NEGATIVE

## 2018-11-14 LAB — OB RESULTS CONSOLE GBS: GBS: NEGATIVE

## 2018-11-14 LAB — RPR: RPR Ser Ql: NONREACTIVE

## 2018-11-14 LAB — ABO/RH: ABO/RH(D): O POS

## 2018-11-14 LAB — OB RESULTS CONSOLE HIV ANTIBODY (ROUTINE TESTING): HIV: NONREACTIVE

## 2018-11-14 LAB — OB RESULTS CONSOLE RPR: RPR: NONREACTIVE

## 2018-11-14 MED ORDER — OXYCODONE-ACETAMINOPHEN 5-325 MG PO TABS: 2.0000 | ORAL_TABLET | ORAL | Status: DC | PRN

## 2018-11-14 MED ORDER — GENTAMICIN SULFATE 40 MG/ML IJ SOLN
5.0000 mg/kg | Freq: Once | INTRAVENOUS | Status: AC
  Administered 2018-11-15: 320 mg via INTRAVENOUS
  Filled 2018-11-14: qty 8

## 2018-11-14 MED ORDER — OXYTOCIN 40 UNITS IN NORMAL SALINE INFUSION - SIMPLE MED: 1.0000 m[IU]/min | INTRAVENOUS | Status: DC

## 2018-11-14 MED ORDER — SOD CITRATE-CITRIC ACID 500-334 MG/5ML PO SOLN: 30.0000 mL | ORAL | Status: DC | PRN

## 2018-11-14 MED ORDER — FLEET ENEMA 7-19 GM/118ML RE ENEM: 1.0000 | ENEMA | RECTAL | Status: DC | PRN

## 2018-11-14 MED ORDER — ONDANSETRON HCL 4 MG/2ML IJ SOLN
4.0000 mg | Freq: Four times a day (QID) | INTRAMUSCULAR | Status: DC | PRN
  Administered 2018-11-14: 4 mg via INTRAVENOUS
  Filled 2018-11-14: qty 2

## 2018-11-14 MED ORDER — LIDOCAINE HCL (PF) 1 % IJ SOLN
INTRAMUSCULAR | Status: DC | PRN
  Administered 2018-11-14: 8 mL
  Administered 2018-11-14: 5 mL
  Administered 2018-11-14: 5 mL via EPIDURAL

## 2018-11-14 MED ORDER — OXYTOCIN 40 UNITS IN NORMAL SALINE INFUSION - SIMPLE MED
2.5000 [IU]/h | INTRAVENOUS | Status: DC
  Filled 2018-11-14: qty 1000

## 2018-11-14 MED ORDER — FENTANYL CITRATE (PF) 100 MCG/2ML IJ SOLN
INTRAMUSCULAR | Status: AC
  Administered 2018-11-14: 06:00:00 100 ug via INTRAVENOUS
  Filled 2018-11-14: qty 2

## 2018-11-14 MED ORDER — ACETAMINOPHEN 325 MG PO TABS
650.0000 mg | ORAL_TABLET | ORAL | Status: DC | PRN
  Administered 2018-11-14: 650 mg via ORAL
  Filled 2018-11-14 (×2): qty 2

## 2018-11-14 MED ORDER — SODIUM CHLORIDE (PF) 0.9 % IJ SOLN
INTRAMUSCULAR | Status: DC | PRN
  Administered 2018-11-14: 12 mL/h via EPIDURAL

## 2018-11-14 MED ORDER — LACTATED RINGERS IV SOLN: 500.0000 mL | INTRAVENOUS | Status: DC | PRN

## 2018-11-14 MED ORDER — LACTATED RINGERS IV SOLN
INTRAVENOUS | Status: DC
  Administered 2018-11-14 – 2018-11-15 (×6): via INTRAVENOUS

## 2018-11-14 MED ORDER — FENTANYL-BUPIVACAINE-NACL 0.5-0.125-0.9 MG/250ML-% EP SOLN
EPIDURAL | Status: AC
  Filled 2018-11-14: qty 250

## 2018-11-14 MED ORDER — SODIUM CHLORIDE 0.9 % IV SOLN
2.0000 g | Freq: Once | INTRAVENOUS | Status: AC
  Administered 2018-11-14: 2 g via INTRAVENOUS
  Filled 2018-11-14: qty 2000

## 2018-11-14 MED ORDER — TERBUTALINE SULFATE 1 MG/ML IJ SOLN: 0.2500 mg | Freq: Once | INTRAMUSCULAR | Status: DC | PRN

## 2018-11-14 MED ORDER — OXYTOCIN BOLUS FROM INFUSION: 500.0000 mL | Freq: Once | INTRAVENOUS | Status: DC

## 2018-11-14 MED ORDER — OXYCODONE-ACETAMINOPHEN 5-325 MG PO TABS: 1.0000 | ORAL_TABLET | ORAL | Status: DC | PRN

## 2018-11-14 MED ORDER — OXYTOCIN 40 UNITS IN NORMAL SALINE INFUSION - SIMPLE MED
1.0000 m[IU]/min | INTRAVENOUS | Status: DC
  Administered 2018-11-14: 4 m[IU]/min via INTRAVENOUS
  Administered 2018-11-14: 10 m[IU]/min via INTRAVENOUS
  Administered 2018-11-14: 12 m[IU]/min via INTRAVENOUS
  Administered 2018-11-14: 8 m[IU]/min via INTRAVENOUS
  Administered 2018-11-14: 6 m[IU]/min via INTRAVENOUS

## 2018-11-14 MED ORDER — FENTANYL CITRATE (PF) 100 MCG/2ML IJ SOLN
100.0000 ug | INTRAMUSCULAR | Status: DC | PRN
  Administered 2018-11-14: 06:00:00 100 ug via INTRAVENOUS

## 2018-11-14 MED ORDER — LIDOCAINE HCL (PF) 1 % IJ SOLN: 30.0000 mL | INTRAMUSCULAR | Status: DC | PRN

## 2018-11-14 NOTE — Progress Notes (Signed)
Amanda Foley is a 33 y.o. G2P1001 at [redacted]w[redacted]d admitted for rupture of membranes  Subjective: Pt comfortable with epidural. Husband in room for support.  Objective: BP (!) 149/80   Pulse (!) 102   Temp 99 F (37.2 C) (Axillary)   Resp 20   Ht 5\' 2"  (1.575 m)   Wt 82.3 kg   LMP 01/23/2018 (Exact Date)   SpO2 98%   BMI 33.19 kg/m  No intake/output data recorded. No intake/output data recorded.  FHT:  FHR: 135 bpm, variability: moderate,  accelerations:  Present,  decelerations:  Absent UC:   regular, every 3-4 minutes. MVUs 180.  SVE:   Dilation: 10 Effacement (%): 100 Station: Plus 1 Exam by:: Jesten Cappuccio leftwich-kirby cnm  Labs: Lab Results  Component Value Date   WBC 14.2 (H) 11/14/2018   HGB 10.5 (L) 11/14/2018   HCT 33.3 (L) 11/14/2018   MCV 80.6 11/14/2018   PLT 271 11/14/2018    Assessment / Plan: Augmentation of labor, progressing well  Labor: Test pushed with descent with CNM so pt to continue pushing at this time.   Preeclampsia:  n/a Fetal Wellbeing:  Category I Pain Control:  Epidural I/D:  GBS neg Anticipated MOD:  VBAC  Cliffard Hair Leftwich-Kirby 11/14/2018, 6:08 PM

## 2018-11-14 NOTE — Progress Notes (Signed)
Amanda Foley is a 33 y.o. G2P1001 at [redacted]w[redacted]d by LMP admitted for rupture of membranes, meconium stained fluid, was planning homebirth with CPM, transferred in due to meconium. Hx C/S x 1 for failure to progress.  Subjective: Pt comfortable now with replaced epidural.  S/O in room for support.  Objective: BP (!) 110/59   Pulse 87   Temp 98.1 F (36.7 C) (Oral)   Resp 20   Ht 5\' 2"  (1.575 m)   Wt 82.3 kg   LMP 01/23/2018 (Exact Date)   SpO2 98%   BMI 33.19 kg/m  No intake/output data recorded. No intake/output data recorded.  FHT:  FHR: 145 bpm, variability: moderate,  accelerations:  Present,  decelerations:  Present rare variables, occasional early decels UC:   regular, every 6 minutes SVE:   Dilation: 6 Effacement (%): 90 Station: -1 Exam by:: sowder rnc   Labs: Lab Results  Component Value Date   WBC 14.2 (H) 11/14/2018   HGB 10.5 (L) 11/14/2018   HCT 33.3 (L) 11/14/2018   MCV 80.6 11/14/2018   PLT 271 11/14/2018    Assessment / Plan: Spontaneous labor, progressing normally  Labor: Plan for expectant management for now, pt to relax after new epidural. Discussed augmentation with Pitocin if contractions remain spaced apart and no cervical change. Pt agrees with plan of care. Preeclampsia:  n/a Fetal Wellbeing:  Category I Pain Control:  Epidural I/D:  GBS neg PCR Anticipated MOD:  NSVD  Amanda Foley 11/14/2018, 9:03 AM

## 2018-11-14 NOTE — Progress Notes (Signed)
Amanda Foley is a 33 y.o. G2P1001 at [redacted]w[redacted]d admitted for rupture of membranes  Subjective: Pt comfortable with epidural. S/O at bedside for support.   Objective: BP (!) 121/59   Pulse 85   Temp 97.8 F (36.6 C) (Oral)   Resp 20   Ht 5\' 2"  (1.575 m)   Wt 82.3 kg   LMP 01/23/2018 (Exact Date)   SpO2 98%   BMI 33.19 kg/m  No intake/output data recorded. No intake/output data recorded.  FHT:  FHR: 135 bpm, variability: moderate,  accelerations:  Present,  decelerations:  Present occasional variables UC:   regular, every 5-6 minutes SVE:   Dilation: 7 Effacement (%): 100 Station: 0 Exam by:: Fatima Blank CMN   Labs: Lab Results  Component Value Date   WBC 14.2 (H) 11/14/2018   HGB 10.5 (L) 11/14/2018   HCT 33.3 (L) 11/14/2018   MCV 80.6 11/14/2018   PLT 271 11/14/2018    Assessment / Plan: Protracted active phase  Labor: Discussed options for protracted labor including Pitocin. Pt would like to try nipple stimulation first, as this worked at home to strengthen labor.  Pt to use nipple stimulation x 1 hour and reevaluate contraction pattern.  Preeclampsia:  n/a Fetal Wellbeing:  Category I Pain Control:  Epidural I/D:  GBS neg Anticipated MOD:  VBAC  Aikam Vinje Leftwich-Kirby 11/14/2018, 1:19 PM

## 2018-11-14 NOTE — Progress Notes (Signed)
Met w/ patient at beside. Resting comfortably. Category 1 tracing. Has progressed from 6 to 7 cm. TOLAC (hx arrest). Discussed that if at next check no progress, plan would be iupc and pitocin; pt in agreement. Also in agreement that if with pitocin no progress in 4-6 hours, plan would be c/s.

## 2018-11-14 NOTE — Anesthesia Procedure Notes (Signed)
Epidural Patient location during procedure: OB Start time: 11/14/2018 8:02 AM End time: 11/14/2018 8:04 AM  Staffing Anesthesiologist: Lyn Hollingshead, MD Performed: anesthesiologist   Preanesthetic Checklist Completed: patient identified, site marked, surgical consent, pre-op evaluation, timeout performed, IV checked, risks and benefits discussed and monitors and equipment checked  Epidural Patient position: sitting Prep: site prepped and draped and DuraPrep Patient monitoring: continuous pulse ox and blood pressure Approach: midline Location: L3-L4 Injection technique: LOR air  Needle:  Needle type: Tuohy  Needle gauge: 17 G Needle length: 9 cm and 9 Needle insertion depth: 5 cm cm Catheter type: closed end flexible Catheter size: 19 Gauge Catheter at skin depth: 10 cm Test dose: negative and Other  Assessment Events: blood not aspirated, injection not painful, no injection resistance, negative IV test and no paresthesia  Additional Notes Reason for block:procedure for pain

## 2018-11-14 NOTE — Progress Notes (Signed)
Patient ID: Amanda Foley, female   DOB: 22-Jun-1985, 32 y.o.   MRN: [id number]  TOLAC consent reviewed with patient and risks discussed, including uterine rupture, fetal hypoxia, need for emergent cesarean delivery. Discussed benefits, including better healing time, better bonding, less risk of infection, and bleeding.  Patient indicated that she would like to continue with TOLAC.  Truett Mainland, DO 11/14/2018

## 2018-11-14 NOTE — Anesthesia Procedure Notes (Signed)
Epidural Patient location during procedure: OB Start time: 11/14/2018 6:55 AM End time: 11/14/2018 7:07 AM  Staffing Anesthesiologist: Barnet Glasgow, MD Performed: anesthesiologist   Preanesthetic Checklist Completed: patient identified, site marked, surgical consent, pre-op evaluation, timeout performed, IV checked, risks and benefits discussed and monitors and equipment checked  Epidural Patient position: sitting Prep: site prepped and draped and DuraPrep Patient monitoring: continuous pulse ox and blood pressure Approach: midline Location: L3-L4 Injection technique: LOR air  Needle:  Needle type: Tuohy  Needle gauge: 17 G Needle length: 9 cm and 9 Needle insertion depth: 8 cm Catheter type: closed end flexible Catheter size: 19 Gauge Catheter at skin depth: 13 cm Test dose: negative  Assessment Events: blood not aspirated, injection not painful, no injection resistance, negative IV test and no paresthesia  Additional Notes Patient identified. Risks/Benefits/Options discussed with patient including but not limited to bleeding, infection, nerve damage, paralysis, failed block, incomplete pain control, headache, blood pressure changes, nausea, vomiting, reactions to medication both or allergic, itching and postpartum back pain. Confirmed with bedside nurse the patient's most recent platelet count. Confirmed with patient that they are not currently taking any anticoagulation, have any bleeding history or any family history of bleeding disorders. Patient expressed understanding and wished to proceed. All questions were answered. Sterile technique was used throughout the entire procedure. Please see nursing notes for vital signs. Test dose was given through epidural needle and negative prior to continuing to dose epidural or start infusion. Warning signs of high block given to the patient including shortness of breath, tingling/numbness in hands, complete motor block, or any  concerning symptoms with instructions to call for help. Patient was given instructions on fall risk and not to get out of bed. All questions and concerns addressed with instructions to call with any issues.1  Attempt (S) . Patient tolerated procedure well.

## 2018-11-14 NOTE — H&P (Signed)
Amanda Foley is a 33 y.o. G42P1001 female at [redacted]w[redacted]d by certain LMP presenting in active labor.  Was attempting home VBAC w/ CPM, however SROM @ 0400 w/ brown [facility] and CPM recommended transfer to hospital. Contractions began yesterday @ 1500, regulated around 1800.  Reports active fetal movement, contractions: regular, vaginal bleeding: none, membranes: ruptured, meconium. Denies ha, visual changes, ruq/epigastric pain, n/v.   Initiated prenatal care at [facility] at 12 wks. Last visit there @ 25.4wks prior to their closure. Has not had GTT. States CPM gave her a monitor to check sugars at home, checked fastings x 1wk, all <95.   This pregnancy complicated by: Postdates MSF Previous c/s  Prenatal History/Complications:  2 layer closure C/S for FTP, was 7cm, regressed to 4cm w/ edematous cx & caput, baby was ROT, weighed 3605g.  2hr GTT that pregnancy elevated: 81/171/164, however she states she was never told she had GDM and per pt CNM said her results must have gotten confused w/ someone else  Past Medical History: No past medical history on file.  Past Surgical History: Past Surgical History:  Procedure Laterality Date  . CESAREAN SECTION N/A 08/16/2016   Procedure: CESAREAN SECTION;  Surgeon: Brien Few, MD;  Location: [place];  Service: Obstetrics;  Laterality: N/A;  . CESAREAN SECTION    . WISDOM TOOTH EXTRACTION      Obstetrical History: OB History    Gravida  2   Para  1   Term  1   Preterm      AB      Living  1     SAB      TAB      Ectopic      Multiple  0   Live Births  1           Social History: Social History   Socioeconomic History  . Marital status: Married    Spouse name: Not on file  . Number of children: Not on file  . Years of education: Not on file  . Highest education level: Not on file  Occupational History  . Not on file  Social Needs  . Financial resource strain: Not on file  . Food insecurity   Worry: Not on file    Inability: Not on file  . Transportation needs    Medical: No    Non-medical: No  Tobacco Use  . Smoking status: Never Smoker  . Smokeless tobacco: Never Used  Substance and Sexual Activity  . Alcohol use: No  . Drug use: No  . Sexual activity: Yes    Birth control/protection: None  Lifestyle  . Physical activity    Days per week: Not on file    Minutes per session: Not on file  . Stress: Not on file  Relationships  . Social Herbalist on phone: Not on file    Gets together: Not on file    Attends religious service: Not on file    Active member of club or organization: Not on file    Attends meetings of clubs or organizations: Not on file    Relationship status: Married  Other Topics Concern  . Not on file  Social History Narrative  . Not on file    Family History: No family history on file.  Allergies: Allergies  Allergen Reactions  . Almond (Diagnostic)   . Gluten Meal Other (See Comments)    Reaction: G. I.  . Lactose Intolerance (Gi)  Other (See Comments)    Reaction: G.I.    Medications Prior to Admission  Medication Sig Dispense Refill Last Dose  . Prenatal Vit-Fe Fumarate-FA (PRENATAL MULTIVITAMIN) TABS tablet Take 1 tablet by mouth daily at 12 noon.       Review of Systems  Pertinent pos/neg as indicated in HPI  Last menstrual period 01/23/2018, currently breastfeeding. General appearance: alert, cooperative and mild distress Lungs: clear to auscultation bilaterally Heart: regular rate and rhythm Abdomen: gravid, soft, non-tender Extremities: tr edema DTR's 2+  Fetal monitoring: FHR: 145 bpm, variability: moderate,  Accelerations: Present,  decelerations:  Absent Uterine activity: q 2-65mins Dilation: 6.5 Effacement (%): 90 Station: -1 Exam by:: Elsie Stain Presentation: cephalic   Prenatal labs: ABO, Rh:  O+ Antibody:  neg Rubella:  immune RPR:   NR HBsAg:   NR HIV:   neg GBS:   PCR pending 2hr  GTT: not done, FBS x 1wk <95 per pt  Prenatal Transfer Tool  Maternal Diabetes: unsure, never had GTT, however FBS x 1wk <95 Genetic Screening: Declined Maternal Ultrasounds/Referrals: Normal Fetal Ultrasounds or other Referrals:  None Maternal Substance Abuse:  No Significant Maternal Medications:  None Significant Maternal Lab Results: None, GBS pending  No results found for this or any previous visit (from the past 24 hour(s)).   Assessment:  [redacted]w[redacted]d SIUP  G2P1001  Active labor   Prev c/s for FTP  Cat 1 FHR  GBS  pcr pending  H/O ?GDM  Postdates  Meconium stained fluid  Elevated bp x 2 (possibly from pain)   Plan:  Admit to L&D  IV pain meds/epidural prn active labor  Expectant management  Anticipate VBAC   Plans to breastfeed  Contraception: NFP  Circumcision: n/a  Will check pre-e labs  Cheral Marker CNM, WHNP-BC 11/14/2018, 6:09 AM

## 2018-11-14 NOTE — Progress Notes (Addendum)
Amanda Foley is a 33 y.o. G2P1001 at [redacted]w[redacted]d by LMP admitted for rupture of membranes  Subjective: Pt comfortable with epidural, feeling some intermittent pelvic pressure that is mild.  Objective: BP 121/82   Pulse 85   Temp 98.2 F (36.8 C) (Oral)   Resp 18   Ht 5\' 2"  (1.575 m)   Wt 82.3 kg   LMP 01/23/2018 (Exact Date)   SpO2 98%   BMI 33.19 kg/m  No intake/output data recorded. No intake/output data recorded.  FHT:  FHR: 135 bpm, variability: moderate,  accelerations:  Abscent,  decelerations:  Absent UC:   regular, every 3-4 minutes SVE:   Dilation: 8 Effacement (%): 100 Station: 0 Exam by:: Lattie Haw CNM IUPC placed without difficulty. Pt tolerated procedure well. Thick meconium noted during exam.   Labs: Lab Results  Component Value Date   WBC 14.2 (H) 11/14/2018   HGB 10.5 (L) 11/14/2018   HCT 33.3 (L) 11/14/2018   MCV 80.6 11/14/2018   PLT 271 11/14/2018    Assessment / Plan: Arrest in active phase of labor  Labor: Slow progress, fetal position ROP at this time.  Discussed options with pt including continued nipple stimulation vs Pitocin.  Will start Pitocin at this time and place IUPC. RN to reposition patient in lateral sims position to encourage fetal rotation. Pt agrees with plan of care.   Preeclampsia:  n/a Fetal Wellbeing:  Category I Pain Control:  Epidural I/D:  GBS neg Anticipated MOD:  NSVD  Fatima Blank 11/14/2018, 3:42 PM

## 2018-11-14 NOTE — Anesthesia Preprocedure Evaluation (Addendum)
Anesthesia Evaluation  Patient identified by MRN, date of birth, ID band Patient awake    Airway Mallampati: II  TM Distance: >3 FB Neck ROM: Full    Dental no notable dental hx. (+) Teeth Intact   Pulmonary neg pulmonary ROS,    Pulmonary exam normal breath sounds clear to auscultation       Cardiovascular Exercise Tolerance: Good negative cardio ROS Normal cardiovascular exam Rhythm:Regular Rate:Normal     Neuro/Psych negative neurological ROS  negative psych ROS   GI/Hepatic negative GI ROS, Neg liver ROS,   Endo/Other  negative endocrine ROS  Renal/GU negative Renal ROS     Musculoskeletal   Abdominal   Peds  Hematology Hgb 10.3 Plt 271   Anesthesia Other Findings   Reproductive/Obstetrics (+) Pregnancy                            Anesthesia Physical Anesthesia Plan  ASA: II  Anesthesia Plan: Epidural   Post-op Pain Management:    Induction:   PONV Risk Score and Plan:   Airway Management Planned:   Additional Equipment:   Intra-op Plan:   Post-operative Plan:   Informed Consent: I have reviewed the patients History and Physical, chart, labs and discussed the procedure including the risks, benefits and alternatives for the proposed anesthesia with the patient or authorized representative who has indicated his/her understanding and acceptance.     Dental advisory given  Plan Discussed with:   Anesthesia Plan Comments:         Anesthesia Quick Evaluation

## 2018-11-14 NOTE — Progress Notes (Signed)
Amanda Foley is a 33 y.o. G2P1001 at [redacted]w[redacted]d admitted for SROM  Subjective: Reports feeling comfortable with epidural in place and is feeling some pelvic pressure with contractions. Spouse supportive at bedside.   Objective: Vitals:   11/14/18 1930 11/14/18 1946 11/14/18 2003 11/14/18 2030  BP: (!) 116/95  105/65 (!) 113/59  Pulse: 96  96 94  Resp: 18     Temp: 99.1 F (37.3 C) 100.1 F (37.8 C)  (!) 100.7 F (38.2 C)  TempSrc: Oral Axillary  Axillary  SpO2:      Weight:      Height:        No intake/output data recorded. Total I/O In: -  Out: 500 [Urine:500]   FHT:  FHR: 165 bpm, variability: moderate,  accelerations:  Present,  decelerations:  Present early decels with ctx UC:   regular, every 1.5-5 minutes SVE:   Dilation: 10 Effacement (%): 100 Station: Plus 1 Exam by:: Lisa leftwich-kirby cnm  Labs:   Recent Labs    11/14/18 0555  WBC 14.2*  HGB 10.5*  HCT 33.3*  PLT 271    Assessment / Plan: Augmentation of labor, progressing well  Labor: Test push with CNM facilitated fetal descent, plan to continue pushing at this time. Preeclampsia:  no signs or symptoms of toxicity and labs stable Fetal Wellbeing:  Category I Pain Control:  Epidural I/D:  GBS negative Anticipated MOD:  VBAC  [name], CNM, BSN 11/14/2018, 9:09 PM

## 2018-11-14 NOTE — Progress Notes (Signed)
Patient ID: Amanda Foley, female   DOB: 02/18/1985, 33 y.o.   MRN: [id number]  Pushing x 1hr 51min now, making moderate amt of progress  T 100.7 earlier now at 99.3 after Tylenol, other VSS FHR 150s, avg LTV, early variables, +accels Ctx q 2-4 mins with Pit at 39mu/min Vtx has descended a bit, closer to +2  IUP@42 .1wks TOLAC Pushing Triple I  Will give a dose of Amp/Gent- had held off earlier in case pt was to efficiently deliver, but she still will require more pushing

## 2018-11-14 NOTE — MAU Note (Signed)
Pt planning home birth but SROM about 4:30  With meconium .  Good fetal movement. Ctx q 3-4 mion Place pt on monitor FHR 140's. SVE 6.5/90/-1.

## 2018-11-15 ENCOUNTER — Encounter (HOSPITAL_COMMUNITY): Payer: Self-pay | Admitting: *Deleted

## 2018-11-15 ENCOUNTER — Encounter (HOSPITAL_COMMUNITY)
Admission: AD | Disposition: A | Discharge status: Home / Self Care | Payer: Self-pay | Source: Home / Self Care | Attending: Obstetrics & Gynecology

## 2018-11-15 LAB — CBC
HCT: 28 % — ABNORMAL LOW (ref 36.0–46.0)
Hemoglobin: 8.9 g/dL — ABNORMAL LOW (ref 12.0–15.0)
MCH: 25.9 pg — ABNORMAL LOW (ref 26.0–34.0)
MCHC: 31.8 g/dL (ref 30.0–36.0)
MCV: 81.4 fL (ref 80.0–100.0)
Platelets: 223 10*3/uL (ref 150–400)
RBC: 3.44 MIL/uL — ABNORMAL LOW (ref 3.87–5.11)
RDW: 16.2 % — ABNORMAL HIGH (ref 11.5–15.5)
WBC: 21.4 10*3/uL — ABNORMAL HIGH (ref 4.0–10.5)
nRBC: 0 % (ref 0.0–0.2)

## 2018-11-15 SURGERY — Surgical Case
Anesthesia: Epidural

## 2018-11-15 MED ORDER — FENTANYL CITRATE (PF) 100 MCG/2ML IJ SOLN: 25.0000 ug | INTRAMUSCULAR | Status: DC | PRN

## 2018-11-15 MED ORDER — WITCH HAZEL-GLYCERIN EX PADS: 1.0000 "application " | MEDICATED_PAD | CUTANEOUS | Status: DC | PRN

## 2018-11-15 MED ORDER — FERROUS SULFATE 325 (65 FE) MG PO TABS
325.0000 mg | ORAL_TABLET | Freq: Two times a day (BID) | ORAL | Status: DC
  Filled 2018-11-15 (×3): qty 1

## 2018-11-15 MED ORDER — ACETAMINOPHEN 10 MG/ML IV SOLN
1000.0000 mg | Freq: Once | INTRAVENOUS | Status: AC
  Administered 2018-11-15: 1000 mg via INTRAVENOUS

## 2018-11-15 MED ORDER — GABAPENTIN 100 MG PO CAPS
100.0000 mg | ORAL_CAPSULE | Freq: Three times a day (TID) | ORAL | Status: DC
  Administered 2018-11-15 – 2018-11-17 (×5): 100 mg via ORAL
  Filled 2018-11-15 (×6): qty 1

## 2018-11-15 MED ORDER — TRANEXAMIC ACID 1000 MG/10ML IV SOLN
INTRAVENOUS | Status: DC | PRN
  Administered 2018-11-15: 1000 mg via INTRAVENOUS

## 2018-11-15 MED ORDER — MEPERIDINE HCL 25 MG/ML IJ SOLN: 6.2500 mg | INTRAMUSCULAR | Status: DC | PRN

## 2018-11-15 MED ORDER — OXYTOCIN 40 UNITS IN NORMAL SALINE INFUSION - SIMPLE MED: 2.5000 [IU]/h | INTRAVENOUS | Status: AC

## 2018-11-15 MED ORDER — MORPHINE SULFATE (PF) 0.5 MG/ML IJ SOLN
INTRAMUSCULAR | Status: DC | PRN
  Administered 2018-11-15: 3 mg via EPIDURAL

## 2018-11-15 MED ORDER — NALOXONE HCL 0.4 MG/ML IJ SOLN: 0.4000 mg | INTRAMUSCULAR | Status: DC | PRN

## 2018-11-15 MED ORDER — MEPERIDINE HCL 25 MG/ML IJ SOLN
INTRAMUSCULAR | Status: AC
  Filled 2018-11-15: qty 1

## 2018-11-15 MED ORDER — MORPHINE SULFATE (PF) 0.5 MG/ML IJ SOLN
INTRAMUSCULAR | Status: AC
  Filled 2018-11-15: qty 10

## 2018-11-15 MED ORDER — ONDANSETRON HCL 4 MG/2ML IJ SOLN
INTRAMUSCULAR | Status: AC
  Filled 2018-11-15: qty 2

## 2018-11-15 MED ORDER — DIPHENHYDRAMINE HCL 25 MG PO CAPS: 25.0000 mg | ORAL_CAPSULE | Freq: Four times a day (QID) | ORAL | Status: DC | PRN

## 2018-11-15 MED ORDER — ONDANSETRON HCL 4 MG/2ML IJ SOLN
4.0000 mg | Freq: Three times a day (TID) | INTRAMUSCULAR | Status: DC | PRN
  Administered 2018-11-15 – 2018-11-16 (×3): 4 mg via INTRAVENOUS
  Filled 2018-11-15 (×2): qty 2

## 2018-11-15 MED ORDER — SCOPOLAMINE 1 MG/3DAYS TD PT72
MEDICATED_PATCH | TRANSDERMAL | Status: AC
  Filled 2018-11-15: qty 1

## 2018-11-15 MED ORDER — COCONUT OIL OIL: 1.0000 "application " | TOPICAL_OIL | Status: DC | PRN

## 2018-11-15 MED ORDER — NALBUPHINE HCL 10 MG/ML IJ SOLN
5.0000 mg | INTRAMUSCULAR | Status: DC | PRN
  Filled 2018-11-15: qty 0.5

## 2018-11-15 MED ORDER — MEPERIDINE HCL 25 MG/ML IJ SOLN
INTRAMUSCULAR | Status: DC | PRN
  Administered 2018-11-15 (×2): 12.5 mg via INTRAVENOUS

## 2018-11-15 MED ORDER — SODIUM CHLORIDE 0.9% FLUSH: 3.0000 mL | INTRAVENOUS | Status: DC | PRN

## 2018-11-15 MED ORDER — TRANEXAMIC ACID-NACL 1000-0.7 MG/100ML-% IV SOLN
INTRAVENOUS | Status: AC
  Filled 2018-11-15: qty 100

## 2018-11-15 MED ORDER — METHYLERGONOVINE MALEATE 0.2 MG/ML IJ SOLN
INTRAMUSCULAR | Status: DC | PRN
  Administered 2018-11-15: 0.2 mg via INTRAMUSCULAR

## 2018-11-15 MED ORDER — ONDANSETRON HCL 4 MG/2ML IJ SOLN
INTRAMUSCULAR | Status: DC | PRN
  Administered 2018-11-15: 4 mg via INTRAVENOUS

## 2018-11-15 MED ORDER — DIPHENHYDRAMINE HCL 50 MG/ML IJ SOLN: 12.5000 mg | INTRAMUSCULAR | Status: DC | PRN

## 2018-11-15 MED ORDER — ACETAMINOPHEN 10 MG/ML IV SOLN
INTRAVENOUS | Status: AC
  Filled 2018-11-15: qty 100

## 2018-11-15 MED ORDER — DIPHENHYDRAMINE HCL 25 MG PO CAPS: 25.0000 mg | ORAL_CAPSULE | ORAL | Status: DC | PRN

## 2018-11-15 MED ORDER — TETANUS-DIPHTH-ACELL PERTUSSIS 5-2.5-18.5 LF-MCG/0.5 IM SUSP: 0.5000 mL | Freq: Once | INTRAMUSCULAR | Status: DC

## 2018-11-15 MED ORDER — FENTANYL CITRATE (PF) 100 MCG/2ML IJ SOLN
INTRAMUSCULAR | Status: AC
  Filled 2018-11-15: qty 2

## 2018-11-15 MED ORDER — OXYTOCIN 10 UNIT/ML IJ SOLN
INTRAMUSCULAR | Status: AC
  Filled 2018-11-15: qty 1

## 2018-11-15 MED ORDER — SENNOSIDES-DOCUSATE SODIUM 8.6-50 MG PO TABS
2.0000 | ORAL_TABLET | ORAL | Status: DC
  Administered 2018-11-15 – 2018-11-17 (×2): 2 via ORAL
  Filled 2018-11-15 (×2): qty 2

## 2018-11-15 MED ORDER — DEXAMETHASONE SODIUM PHOSPHATE 10 MG/ML IJ SOLN
INTRAMUSCULAR | Status: AC
  Filled 2018-11-15: qty 1

## 2018-11-15 MED ORDER — SODIUM CHLORIDE 0.9 % IV SOLN
INTRAVENOUS | Status: DC | PRN
  Administered 2018-11-15: 03:00:00 via INTRAVENOUS

## 2018-11-15 MED ORDER — IBUPROFEN 600 MG PO TABS
600.0000 mg | ORAL_TABLET | Freq: Four times a day (QID) | ORAL | Status: DC
  Administered 2018-11-16 – 2018-11-17 (×5): 600 mg via ORAL
  Filled 2018-11-15 (×7): qty 1

## 2018-11-15 MED ORDER — KETOROLAC TROMETHAMINE 30 MG/ML IJ SOLN
30.0000 mg | Freq: Four times a day (QID) | INTRAMUSCULAR | Status: AC
  Administered 2018-11-15 (×3): 30 mg via INTRAVENOUS
  Filled 2018-11-15 (×3): qty 1

## 2018-11-15 MED ORDER — TRANEXAMIC ACID-NACL 1000-0.7 MG/100ML-% IV SOLN: 1000.0000 mg | INTRAVENOUS | Status: AC

## 2018-11-15 MED ORDER — SODIUM CHLORIDE 0.9 % IV SOLN
INTRAVENOUS | Status: DC | PRN
  Administered 2018-11-15: 40 [IU] via INTRAVENOUS

## 2018-11-15 MED ORDER — SIMETHICONE 80 MG PO CHEW: 80.0000 mg | CHEWABLE_TABLET | ORAL | Status: DC | PRN

## 2018-11-15 MED ORDER — FENTANYL CITRATE (PF) 100 MCG/2ML IJ SOLN
INTRAMUSCULAR | Status: DC | PRN
  Administered 2018-11-15: 100 ug via INTRAVENOUS
  Administered 2018-11-15: 100 ug via EPIDURAL

## 2018-11-15 MED ORDER — KETOROLAC TROMETHAMINE 30 MG/ML IJ SOLN
INTRAMUSCULAR | Status: AC
  Filled 2018-11-15: qty 1

## 2018-11-15 MED ORDER — SCOPOLAMINE 1 MG/3DAYS TD PT72
1.0000 | MEDICATED_PATCH | Freq: Once | TRANSDERMAL | Status: DC
  Administered 2018-11-16: 1.5 mg via TRANSDERMAL
  Filled 2018-11-15: qty 1

## 2018-11-15 MED ORDER — LIDOCAINE 2% (20 MG/ML) 5 ML SYRINGE
INTRAMUSCULAR | Status: AC
  Filled 2018-11-15: qty 5

## 2018-11-15 MED ORDER — SODIUM CHLORIDE 0.9 % IR SOLN
Status: DC | PRN
  Administered 2018-11-15 (×2): 1

## 2018-11-15 MED ORDER — METHYLERGONOVINE MALEATE 0.2 MG/ML IJ SOLN
INTRAMUSCULAR | Status: AC
  Filled 2018-11-15: qty 1

## 2018-11-15 MED ORDER — PRENATAL MULTIVITAMIN CH: 1.0000 | ORAL_TABLET | Freq: Every day | ORAL | Status: DC

## 2018-11-15 MED ORDER — KETOROLAC TROMETHAMINE 30 MG/ML IJ SOLN
30.0000 mg | Freq: Once | INTRAMUSCULAR | Status: AC | PRN
  Administered 2018-11-15: 30 mg via INTRAVENOUS

## 2018-11-15 MED ORDER — FENTANYL-BUPIVACAINE-NACL 0.5-0.125-0.9 MG/250ML-% EP SOLN
EPIDURAL | Status: AC
  Administered 2018-11-15: 500 ug
  Filled 2018-11-15: qty 250

## 2018-11-15 MED ORDER — SIMETHICONE 80 MG PO CHEW
80.0000 mg | CHEWABLE_TABLET | ORAL | Status: DC
  Administered 2018-11-15 – 2018-11-17 (×2): 80 mg via ORAL
  Filled 2018-11-15 (×2): qty 1

## 2018-11-15 MED ORDER — ENOXAPARIN SODIUM 40 MG/0.4ML ~~LOC~~ SOLN
40.0000 mg | SUBCUTANEOUS | Status: DC
  Administered 2018-11-17: 40 mg via SUBCUTANEOUS
  Filled 2018-11-15 (×3): qty 0.4

## 2018-11-15 MED ORDER — PRENATAL MULTIVITAMIN CH
1.0000 | ORAL_TABLET | Freq: Every day | ORAL | Status: DC
  Filled 2018-11-15 (×2): qty 1

## 2018-11-15 MED ORDER — METOCLOPRAMIDE HCL 5 MG/ML IJ SOLN
INTRAMUSCULAR | Status: DC | PRN
  Administered 2018-11-15: 10 mg via INTRAVENOUS

## 2018-11-15 MED ORDER — METHYLERGONOVINE MALEATE 0.2 MG PO TABS
0.2000 mg | ORAL_TABLET | ORAL | Status: AC
  Administered 2018-11-15 (×4): 0.2 mg via ORAL
  Filled 2018-11-15 (×4): qty 1

## 2018-11-15 MED ORDER — LACTATED RINGERS IV SOLN
INTRAVENOUS | Status: DC
  Administered 2018-11-15 (×2): 125 mL/h via INTRAVENOUS

## 2018-11-15 MED ORDER — ONDANSETRON HCL 4 MG/2ML IJ SOLN: 4.0000 mg | Freq: Once | INTRAMUSCULAR | Status: DC | PRN

## 2018-11-15 MED ORDER — KETOROLAC TROMETHAMINE 30 MG/ML IJ SOLN: 30.0000 mg | Freq: Four times a day (QID) | INTRAMUSCULAR | Status: DC

## 2018-11-15 MED ORDER — CEFAZOLIN SODIUM-DEXTROSE 2-4 GM/100ML-% IV SOLN
INTRAVENOUS | Status: AC
  Filled 2018-11-15: qty 100

## 2018-11-15 MED ORDER — MENTHOL 3 MG MT LOZG: 1.0000 | LOZENGE | OROMUCOSAL | Status: DC | PRN

## 2018-11-15 MED ORDER — OXYCODONE HCL 5 MG PO TABS: 5.0000 mg | ORAL_TABLET | Freq: Once | ORAL | Status: DC | PRN

## 2018-11-15 MED ORDER — NALBUPHINE HCL 10 MG/ML IJ SOLN
5.0000 mg | Freq: Once | INTRAMUSCULAR | Status: DC | PRN
  Filled 2018-11-15: qty 0.5

## 2018-11-15 MED ORDER — LIDOCAINE-EPINEPHRINE (PF) 2 %-1:200000 IJ SOLN
INTRAMUSCULAR | Status: AC
  Filled 2018-11-15: qty 10

## 2018-11-15 MED ORDER — CEFAZOLIN SODIUM-DEXTROSE 2-3 GM-%(50ML) IV SOLR
INTRAVENOUS | Status: DC | PRN
  Administered 2018-11-15: 2 g via INTRAVENOUS

## 2018-11-15 MED ORDER — OXYCODONE HCL 5 MG/5ML PO SOLN: 5.0000 mg | Freq: Once | ORAL | Status: DC | PRN

## 2018-11-15 MED ORDER — METOCLOPRAMIDE HCL 5 MG/ML IJ SOLN
INTRAMUSCULAR | Status: AC
  Filled 2018-11-15: qty 2

## 2018-11-15 MED ORDER — DIBUCAINE (PERIANAL) 1 % EX OINT: 1.0000 "application " | TOPICAL_OINTMENT | CUTANEOUS | Status: DC | PRN

## 2018-11-15 MED ORDER — ZOLPIDEM TARTRATE 5 MG PO TABS: 5.0000 mg | ORAL_TABLET | Freq: Every evening | ORAL | Status: DC | PRN

## 2018-11-15 MED ORDER — IBUPROFEN 800 MG PO TABS: 800.0000 mg | ORAL_TABLET | Freq: Four times a day (QID) | ORAL | Status: DC

## 2018-11-15 MED ORDER — PHENYLEPHRINE 40 MCG/ML (10ML) SYRINGE FOR IV PUSH (FOR BLOOD PRESSURE SUPPORT)
PREFILLED_SYRINGE | INTRAVENOUS | Status: AC
  Filled 2018-11-15: qty 10

## 2018-11-15 MED ORDER — SIMETHICONE 80 MG PO CHEW
80.0000 mg | CHEWABLE_TABLET | Freq: Three times a day (TID) | ORAL | Status: DC
  Administered 2018-11-15 – 2018-11-17 (×5): 80 mg via ORAL
  Filled 2018-11-15 (×7): qty 1

## 2018-11-15 MED ORDER — NALOXONE HCL 4 MG/10ML IJ SOLN
1.0000 ug/kg/h | INTRAVENOUS | Status: DC | PRN
  Filled 2018-11-15: qty 5

## 2018-11-15 MED ORDER — SODIUM CHLORIDE 0.9 % IV SOLN
3.0000 g | Freq: Four times a day (QID) | INTRAVENOUS | Status: AC
  Administered 2018-11-15 – 2018-11-16 (×4): 3 g via INTRAVENOUS
  Filled 2018-11-15: qty 8
  Filled 2018-11-15 (×3): qty 3

## 2018-11-15 MED ORDER — OXYTOCIN 40 UNITS IN NORMAL SALINE INFUSION - SIMPLE MED
INTRAVENOUS | Status: AC
  Filled 2018-11-15: qty 1000

## 2018-11-15 MED ORDER — SODIUM BICARBONATE 8.4 % IV SOLN
INTRAVENOUS | Status: DC | PRN
  Administered 2018-11-15: 10 mL via EPIDURAL
  Administered 2018-11-15 (×2): 5 mL via EPIDURAL

## 2018-11-15 MED ORDER — DEXAMETHASONE SODIUM PHOSPHATE 10 MG/ML IJ SOLN
INTRAMUSCULAR | Status: DC | PRN
  Administered 2018-11-15: 10 mg via INTRAVENOUS

## 2018-11-15 SURGICAL SUPPLY — 39 items
CHLORAPREP W/TINT 26ML (MISCELLANEOUS) ×3 IMPLANT
CLAMP CORD UMBIL (MISCELLANEOUS) IMPLANT
CLOTH BEACON ORANGE TIMEOUT ST (SAFETY) ×3 IMPLANT
DERMABOND ADVANCED (GAUZE/BANDAGES/DRESSINGS) ×2
DERMABOND ADVANCED .7 DNX12 (GAUZE/BANDAGES/DRESSINGS) ×2 IMPLANT
DRSG OPSITE POSTOP 4X10 (GAUZE/BANDAGES/DRESSINGS) ×3 IMPLANT
ELECT REM PT RETURN 9FT ADLT (ELECTROSURGICAL) ×3
ELECTRODE REM PT RTRN 9FT ADLT (ELECTROSURGICAL) ×1 IMPLANT
EXTRACTOR VACUUM BELL STYLE (SUCTIONS) IMPLANT
GLOVE BIOGEL PI IND STRL 7.0 (GLOVE) ×1 IMPLANT
GLOVE BIOGEL PI IND STRL 8 (GLOVE) ×1 IMPLANT
GLOVE BIOGEL PI INDICATOR 7.0 (GLOVE) ×2
GLOVE BIOGEL PI INDICATOR 8 (GLOVE) ×2
GLOVE ECLIPSE 8.0 STRL XLNG CF (GLOVE) ×3 IMPLANT
GOWN STRL REUS W/TWL LRG LVL3 (GOWN DISPOSABLE) ×6 IMPLANT
KIT ABG SYR 3ML LUER SLIP (SYRINGE) ×3 IMPLANT
NDL HYPO 18GX1.5 BLUNT FILL (NEEDLE) ×1 IMPLANT
NDL HYPO 25X5/8 SAFETYGLIDE (NEEDLE) ×1 IMPLANT
NEEDLE HYPO 18GX1.5 BLUNT FILL (NEEDLE) ×3 IMPLANT
NEEDLE HYPO 22GX1.5 SAFETY (NEEDLE) ×3 IMPLANT
NEEDLE HYPO 25X5/8 SAFETYGLIDE (NEEDLE) ×3 IMPLANT
NS IRRIG 1000ML POUR BTL (IV SOLUTION) ×3 IMPLANT
PACK C SECTION WH (CUSTOM PROCEDURE TRAY) ×3 IMPLANT
PAD OB MATERNITY 4.3X12.25 (PERSONAL CARE ITEMS) ×3 IMPLANT
PENCIL SMOKE EVAC W/HOLSTER (ELECTROSURGICAL) ×3 IMPLANT
RTRCTR C-SECT PINK 25CM LRG (MISCELLANEOUS) IMPLANT
SUT CHROMIC 0 CT 1 (SUTURE) ×3 IMPLANT
SUT MNCRL 0 VIOLET CTX 36 (SUTURE) ×2 IMPLANT
SUT MONOCRYL 0 CTX 36 (SUTURE) ×4
SUT PLAIN 2 0 (SUTURE)
SUT PLAIN 2 0 XLH (SUTURE) IMPLANT
SUT PLAIN ABS 2-0 CT1 27XMFL (SUTURE) IMPLANT
SUT VIC AB 0 CTX 36 (SUTURE) ×2
SUT VIC AB 0 CTX36XBRD ANBCTRL (SUTURE) ×1 IMPLANT
SUT VIC AB 4-0 KS 27 (SUTURE) IMPLANT
SYR 20CC LL (SYRINGE) ×6 IMPLANT
TOWEL OR 17X24 6PK STRL BLUE (TOWEL DISPOSABLE) ×3 IMPLANT
TRAY FOLEY W/BAG SLVR 14FR LF (SET/KITS/TRAYS/PACK) IMPLANT
WATER STERILE IRR 1000ML POUR (IV SOLUTION) ×3 IMPLANT

## 2018-11-15 NOTE — Progress Notes (Signed)
This note also relates to the following rows which could not be included: Dose (milli-units/min) Oxytocin - Cannot attach notes to extension rows Rate (mL/hr) Oxytocin - Cannot attach notes to extension rows Concentration Oxytocin - Cannot attach notes to extension rows   Pt pushing with contractions without coaching. Pitocin turned off at 01:31

## 2018-11-15 NOTE — Op Note (Signed)
Preoperative diagnosis:  1.  Intrauterine pregnancy at [redacted]w[redacted]d  weeks gestation                                         2.  Previous Caesarean section                                         3.  VBAC attempt at home                                         4.  Arrest of descent in 2nd stage of labor(6 hours)                                         5. Intra amniotic infection   Postoperative diagnosis:  Same as above + postpartum uterine atony  Procedure:  Repeat cesarean section  Surgeon:  Florian Buff MD  Assistant:    Anesthesia: Epidural  Findings:  .    Over a low transverse incision was delivered a viable female with Apgars of 8 and 9 weighing pending lbs.  oz. Uterus, tubes and ovaries were all normal.  There were no other significant findings  Description of operation:  Patient was taken to the operating room and had her epidural dosed up. She was then placed in the supine position with tilt to the left side. When adequate anesthetic level was obtained she was prepped and draped in usual sterile fashion. A Foley catheter was placed previously after her epidural. A Pfannenstiel skin incision was made and carried down sharply to the rectus fascia which was scored in the midline extended laterally. The fascia was taken off the muscles both superiorly and without difficulty. The muscles were divided.  The peritoneal cavity was entered.  Bladder blade was placed, no bladder flap was created.  A low transverse hysterotomy incision was made and delivered a viable female  infant at 74 with Apgars of 8 and 9 weighingpending lbs  oz.  Cord pH was obtained and was 7.13. The uterus was exteriorized. It was closed in 2 layers, the first being a running interlocking layer and the second being an imbricating layer using 0 monocryl on a CTX needle. There was good resulting hemostasis. The uterus was atonic after delivery and required methergine and TXA for improved tone and hemostasis from the muscle  itself.   The uterus tubes and ovaries were all normal. Peritoneal cavity was irrigated vigorously. The muscles and peritoneum were reapproximated loosely. The fascia was closed using 0 Vicryl in running fashion. Subcutaneous tissue was made hemostatic and irrigated. The skin was closed using 4-0 Vicryl on a Keith needle in a subcuticular fashion.  Dermabond was placed for additional wound integrity and to serve as a barrier. Blood loss for the procedure was 1123 cc. The patient received a 2 gram of Ancef prophylactically. The patient was taken to the recovery room in good stable condition with all counts being correct x3.  EBL 1123 cc  She will get another dose of TXA and 24 hours of methrgine  Florian Buff 11/15/2018 4:09  AM    

## 2018-11-15 NOTE — Lactation Note (Signed)
This note was copied from a baby's chart. Lactation Consultation Note  Patient Name: Amanda Foley Today's Date: 11/15/2018   Sharyn Lull, RN had said that Mom wanted another visit. On return, family appeared to be sleeping. I notified Sharyn Lull, RN and mentioned to her the possibility of trying a sitting football position on the R breast.  Matthias Hughs [facility] 11/15/2018, 7:24 PM

## 2018-11-15 NOTE — Transfer of Care (Signed)
Immediate Anesthesia Transfer of Care Note  Patient: Amanda Foley  Procedure(s) Performed: CESAREAN SECTION (N/A )  Patient Location: PACU  Anesthesia Type:Epidural  Level of Consciousness: awake, alert , oriented and patient cooperative  Airway & Oxygen Therapy: Patient Spontanous Breathing  Post-op Assessment: Report given to RN and Post -op Vital signs reviewed and stable  Post vital signs: Reviewed and stable  Last Vitals:  Vitals Value Taken Time  BP 141/83 11/15/18 0415  Temp    Pulse 94 11/15/18 0416  Resp 21 11/15/18 0416  SpO2 97 % 11/15/18 0416  Vitals shown include unvalidated device data.  Last Pain:  Vitals:   11/15/18 0221  TempSrc: Axillary  PainSc:          Complications: No apparent anesthesia complications

## 2018-11-15 NOTE — Progress Notes (Signed)
Amanda Foley is a 33 y.o. G2P1001 at [redacted]w[redacted]d admitted for SROM  Subjective: Feeling mild pelvic discomfort with contractions while pushing. FOB supportive at bedside.  Objective: Vitals:   11/14/18 2250 11/14/18 2300 11/14/18 2330 11/15/18 0000  BP:  130/74 (!) 105/59 (!) 143/100  Pulse:  99 (!) 106 (!) 109  Resp:  18    Temp: (!) 100.4 F (38 C)     TempSrc: Axillary     SpO2:      Weight:      Height:        No intake/output data recorded. Total I/O In: -  Out: 500 [Urine:500]   FHT:  FHR: 175 bpm, variability: moderate,  accelerations:  Present,  decelerations:  Present variable decelerations with ctx while pushing UC:   regular, every 2-3 minutes SVE:   Dilation: 10 Effacement (%): 100 Station: Plus 1 Exam by:: Lisa leftwich-kirby cnm  Labs:   Recent Labs    11/14/18 0555  WBC 14.2*  HGB 10.5*  HCT 33.3*  PLT 271    Assessment / Plan: Augmentation of labor, progressing well  Labor: Currently pushing with ctx with position changes. Have pushed in hands and knees, right and left side lying, and lithotomy positions. Currently pushing in right side lying. No fetal descent noted with pushing for past hour. Dr. Elonda Husky notified and will come for evaluation. Preeclampsia:  no signs or symptoms of toxicity and labs stable Fetal Wellbeing:  Category II Pain Control:  Epidural I/D:  GBS negative Anticipated MOD:  Guarded  [name], CNM, BSN 11/15/2018, 12:06 AM

## 2018-11-15 NOTE — Anesthesia Postprocedure Evaluation (Signed)
Anesthesia Post Note  Patient: Amanda Foley  Procedure(s) Performed: CESAREAN SECTION (N/A )     Patient location during evaluation: PACU Anesthesia Type: Epidural Level of consciousness: oriented and awake and alert Pain management: pain level controlled Vital Signs Assessment: post-procedure vital signs reviewed and stable Respiratory status: spontaneous breathing, respiratory function stable and nonlabored ventilation Cardiovascular status: blood pressure returned to baseline and stable Postop Assessment: no headache, no backache, no apparent nausea or vomiting and epidural receding Anesthetic complications: no    Last Vitals:  Vitals:   11/15/18 0445 11/15/18 0500  BP: 136/74 (!) 128/93  Pulse: (!) 113 (!) 109  Resp: (!) 24 19  Temp:    SpO2: 100% 97%    Last Pain:  Vitals:   11/15/18 0445  TempSrc:   PainSc: 7    Pain Goal:                Epidural/Spinal Function Cutaneous sensation: Normal sensation (11/15/18 0445), Patient able to flex knees: Yes (11/15/18 0445), Patient able to lift hips off bed: Yes (11/15/18 0445), Back pain beyond tenderness at insertion site: No (11/15/18 0445), Progressively worsening motor and/or sensory loss: No (11/15/18 0445), Bowel and/or bladder incontinence post epidural: No (11/15/18 0445)  Lidia Collum

## 2018-11-15 NOTE — Lactation Note (Signed)
This note was copied from a baby's chart. Lactation Consultation Note  Patient Name: Amanda Foley Today's Date: 11/15/2018    Initial visit at 8 hours of life. Mom had a long labor, long second stage, followed by C/S. Mom has discomfort when infant is feeding on the R breast, but not the L breast. I observed her feeding from the R breast. Mom feels like infant is "chomping." Infant is able to latch with relative ease, but lower lip may not be flanged well and infant may need a wider gape. I attempted to relieve this, but "Amanda Foley" resisted when at breast.   Infant was noted to have a suck:swallow ratio of 1:1 when feeding.   Plan: 1. Do jaw massage & infant is in a quiet alert state, practice mouth exercises that accentuate pulling down/flanging lower lip (this was shown to parents). 2. Next time feeding on the R breast, we may try putting infant in sitting football hold (since infant feeds well with L shoulder upwards).   Parents know how to call us to return.   Note: Mom is a P2. She is still nursing her 1st child at night (now 3 yo). Dr. 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   Amanda Foley [facility] 11/15/2018, 11:53 AM

## 2018-11-16 ENCOUNTER — Inpatient Hospital Stay (HOSPITAL_COMMUNITY): Payer: Self-pay

## 2018-11-16 LAB — CBC
HCT: 23.7 % — ABNORMAL LOW (ref 36.0–46.0)
Hemoglobin: 7.5 g/dL — ABNORMAL LOW (ref 12.0–15.0)
MCH: 25.4 pg — ABNORMAL LOW (ref 26.0–34.0)
MCHC: 31.6 g/dL (ref 30.0–36.0)
MCV: 80.3 fL (ref 80.0–100.0)
Platelets: 218 10*3/uL (ref 150–400)
RBC: 2.95 MIL/uL — ABNORMAL LOW (ref 3.87–5.11)
RDW: 16.4 % — ABNORMAL HIGH (ref 11.5–15.5)
WBC: 17.6 10*3/uL — ABNORMAL HIGH (ref 4.0–10.5)
nRBC: 0 % (ref 0.0–0.2)

## 2018-11-16 LAB — SURGICAL PATHOLOGY

## 2018-11-16 MED ORDER — LACTATED RINGERS IV BOLUS
1000.0000 mL | Freq: Once | INTRAVENOUS | Status: AC
  Administered 2018-11-16: 1000 mL via INTRAVENOUS

## 2018-11-16 MED ORDER — METOCLOPRAMIDE HCL 10 MG PO TABS
10.0000 mg | ORAL_TABLET | Freq: Once | ORAL | Status: AC
  Administered 2018-11-16: 10 mg via ORAL
  Filled 2018-11-16: qty 1

## 2018-11-16 MED ORDER — POLYETHYLENE GLYCOL 3350 17 G PO PACK
17.0000 g | PACK | Freq: Every day | ORAL | Status: DC
  Filled 2018-11-16 (×2): qty 1

## 2018-11-16 MED ORDER — CHEWING GUM (ORBIT) SUGAR FREE
1.0000 | CHEWING_GUM | ORAL | Status: DC | PRN
  Filled 2018-11-16 (×2): qty 1

## 2018-11-16 MED ORDER — ONDANSETRON HCL 4 MG PO TABS
4.0000 mg | ORAL_TABLET | Freq: Three times a day (TID) | ORAL | Status: DC | PRN
  Administered 2018-11-17 (×3): 4 mg via ORAL
  Filled 2018-11-16 (×3): qty 1

## 2018-11-16 NOTE — Clinical Social Work Maternal (Signed)
CLINICAL SOCIAL WORK MATERNAL/CHILD NOTE  Patient Details  Name: Amanda Foley MRN: [id number] Date of Birth: 03/16/1985  Date:  11/16/2018  Clinical Social Worker Initiating Note:  Airiel Oblinger Date/Time: Initiated:  11/16/18/1100     Child's Name:  Abigail Frey   Biological Parents:  Mother, Father(Gioia Kitts and William Frey DOB: 06/21/1990)   Need for Interpreter:  None   Reason for Referral:  Late or No Prenatal Care (Limited prenatal care - stopped at 26 weeks)   Address:  [address] [place] [address]    Phone number:  [phone] (home)     Additional phone number:   Household Members/Support Persons (HM/SP):   Household Member/Support Person 1, Household Member/Support Person 2   HM/SP Name Relationship DOB or Age  HM/SP -1 William Frey FOB 06/21/1990  HM/SP -2 Evelyn Frey Daughter 08/16/2016  HM/SP -3        HM/SP -4        HM/SP -5        HM/SP -6        HM/SP -7        HM/SP -8          Natural Supports (not living in the home):  Parent, Friends   Professional Supports: None   Employment:     Type of Work: Hair stylist   Education:  Vocation/technical training   Homebound arranged:    Financial Resources:      Other Resources:      Cultural/Religious Considerations Which May Impact Care:    Strengths:  Ability to meet basic needs , Home prepared for child , Pediatrician chosen   Psychotropic Medications:         Pediatrician:    [place]  Pediatrician List:   [place]    [place]     County Other([facility]mary [facility] - [facility])  [place]    [place]    [place]      Pediatrician Fax Number:    Risk Factors/Current Problems:      Cognitive State:  Alert , Able to Concentrate    Mood/Affect:  Calm , Other (Comment)(MOB not feeling well and spent most of assessment with eyes closed but was attentive and appropriate)   CSW Assessment:  CSW  received consult for limited prenatal care as MOB stopped receiving care at 26 weeks. CSW met with MOB to offer support and complete assessment.    MOB resting in bed with FOB holding infant at bedside, when CSW entered the room. MOB visibly uncomfortable and did not appear to be feeling well. CSW offered to come back at a later time but MOB and FOB welcoming of visit. MOB spent most of assessment with eyes closed as she was feeling nauseas but was attentive and engaged during assessment. FOB also an active participant and answered basic questions to give MOB a break. Per MOB, she and FOB live together with their 2-year-old daughter. 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 CSW inquired about MOB's mental health history and MOB denied having any and denied any history of PPD/PPA. CSW provided education regarding the baby blues period vs. perinatal mood disorders. CSW recommends self-evaluation during the postpartum time period using the New Mom Checklist from Postpartum Progress and encouraged MOB to contact a medical professional if symptoms are noted at any time. MOB did not appear to be displaying any acute mental health symptoms and denied   any current SI or HI. MOB reported having good support from FOB, her mom and a couple of friends.   CSW inquired about reasoning for MOB's limited prenatal care. Per MOB, she was receiving care at the [facility] but they closed down. MOB and FOB reported that at that time they had to make a decision and they chose to do a home birth and had a CPM. CSW informed MOB of Hospital Drug Policy and explained UDS came back negative but that CDS was still pending and a CPS report would be made, if warranted. MOB and FOB denied any questions or concerns and denied any previous CPS involvement.   MOB and FOB confirmed having all essential items for infant once discharged and reported infant would be sleeping in a pack 'n'  play once home. CSW provided review of Sudden Infant Death Syndrome (SIDS) precautions and safe sleeping habits.     CSW Plan/Description:  No Further Intervention Required/No Barriers to Discharge, Perinatal Mood and Anxiety Disorder (PMADs) Education, Sudden Infant Death Syndrome (SIDS) Education, Hospital Drug Screen Policy Information, CSW Will Continue to Monitor Umbilical Cord Tissue Drug Screen Results and Make Report if Warranted    Aubreyana Saltz, LCSWA 11/16/2018, 11:32 AM 

## 2018-11-16 NOTE — Progress Notes (Signed)
POSTPARTUM PROGRESS NOTE  POD #1  Subjective:  Amanda Foley is a 33 y.o. [id number] s/p LTCS at [redacted]w[redacted]d.  Patient reports uncontrolled vomiting since yesterday afternoon.  She says that she ate a big meal around lunchtime and felt like she was doing better but then noticed increased nausea and then vomiting.  She received 2 doses of Zofran overnight with little relief.  She also had a scopolamine patch placed with little to no relief.  This morning she has been very upset and nauseous.  She reports she can keep nothing down.  She was initially drinking water but says it tastes bad now so is drinking ginger ale.  Patient reports no issues with urination and says that she does not think she has passed any flatulence since her C-section.  She does report a large amount of belching.  Patient's husband reports he thinks she may have had some flatulence this morning.  Patient denies any bowel movements today.  Patient reports that her pain is not terrible but not well controlled because she has been unable to take her medications due to the nausea and vomiting.  Objective: Blood pressure 125/80, pulse 100, temperature 97.7 F (36.5 C), temperature source Oral, resp. rate 20, height 5\' 2"  (1.575 m), weight 82.3 kg, last menstrual period 01/23/2018, SpO2 100 %, unknown if currently breastfeeding.  Physical Exam:  General: alert, cooperative but in distress Chest: no respiratory distress Heart:regular rate, distal pulses intact Abdomen: soft, appropriately tender, bowel sounds are decreased but not truly hypoactive Uterine Fundus: firm, appropriately tender DVT Evaluation: No calf swelling or tenderness Extremities: No edema Skin: warm, dry; incision clean/dry/intact w/ honeycomb dressing in place  Recent Labs    11/15/18 0642 11/16/18 0906  HGB 8.9* 7.5*  HCT 28.0* 23.7*    Assessment/Plan: Amanda Foley is a 33 y.o. H3Z1696 s/p LTCS at [redacted]w[redacted]d for arrest of second stage labor.  POD#1 - Doing  welll; pain moderately controlled. H/H appropriate  Routine postpartum care  OOB, ambulated   Anemia: asymptomatic  Start po ferrous sulfate BID  Nausea and vomiting -Continue to monitor strict I's and O's -Monitor for flatulence - Continue Zofran regimen  Pain control -Consider IV pain medications if unable to keep oral medications down  Contraception: NFP Feeding: Breast  Dispo: Plan for discharge pending further evaluation   LOS: 2 days   Gifford Shave, MD  PGY-1, [facility]  11/16/2018, 10:58 AM

## 2018-11-16 NOTE — Progress Notes (Addendum)
OB/GYN Interim Progress Note  Call nurse to check in on patient's persistent nausea and vomiting. She continues to be nauseous and unable to tolerate PO. She has not passed flatus. Abdomen appears distended. UOP 2.15 overnight. Given recent surgery, differential at this time includes bowel obstruction vs ileus.  - obtain KUB - chewing gum  - OOB and ambulate as tolerated  - consider starting maintenance fluids if continues to be unable to tolerate PO  Discussed case with Dr. Rip Harbour. IN addition to above, plan to given Doculax 10mg  suppository x1 now. If no bowel movement in 4 hours, give another Doculax 10mg  suppository.  Danna Hefty, DO 11/16/2018, 11:35 PM PGY-2, [place]

## 2018-11-17 ENCOUNTER — Encounter (HOSPITAL_COMMUNITY): Payer: Self-pay | Admitting: *Deleted

## 2018-11-17 ENCOUNTER — Inpatient Hospital Stay (HOSPITAL_COMMUNITY): Payer: Self-pay

## 2018-11-17 MED ORDER — BISACODYL 10 MG RE SUPP
10.0000 mg | Freq: Once | RECTAL | Status: AC
  Administered 2018-11-17: 10 mg via RECTAL
  Filled 2018-11-17: qty 1

## 2018-11-17 MED ORDER — PHENOL 1.4 % MT LIQD
2.0000 | OROMUCOSAL | Status: DC | PRN
  Administered 2018-11-17: 2 via OROMUCOSAL
  Filled 2018-11-17: qty 177

## 2018-11-17 MED ORDER — SODIUM CHLORIDE 0.9 % IV SOLN
510.0000 mg | Freq: Once | INTRAVENOUS | Status: AC
  Administered 2018-11-17: 510 mg via INTRAVENOUS
  Filled 2018-11-17: qty 17

## 2018-11-17 MED ORDER — LACTATED RINGERS IV SOLN
INTRAVENOUS | Status: DC
  Administered 2018-11-17 – 2018-11-18 (×2): via INTRAVENOUS

## 2018-11-17 MED ORDER — LORAZEPAM 2 MG/ML IJ SOLN
1.0000 mg | Freq: Once | INTRAMUSCULAR | Status: AC
  Administered 2018-11-17: 1 mg via INTRAVENOUS
  Filled 2018-11-17: qty 1

## 2018-11-17 MED ORDER — HYDROCODONE-ACETAMINOPHEN 5-325 MG PO TABS: 1.0000 | ORAL_TABLET | ORAL | Status: DC | PRN

## 2018-11-17 MED ORDER — IOHEXOL 300 MG/ML  SOLN: 30.0000 mL | Freq: Once | INTRAMUSCULAR | Status: DC | PRN

## 2018-11-17 MED ORDER — IOHEXOL 300 MG/ML  SOLN
100.0000 mL | Freq: Once | INTRAMUSCULAR | Status: AC | PRN
  Administered 2018-11-17: 100 mL via INTRAVENOUS

## 2018-11-17 MED ORDER — KETOROLAC TROMETHAMINE 30 MG/ML IJ SOLN
30.0000 mg | Freq: Once | INTRAMUSCULAR | Status: AC
  Administered 2018-11-17: 30 mg via INTRAVENOUS
  Filled 2018-11-17: qty 1

## 2018-11-17 NOTE — Progress Notes (Signed)
Patient RN called stating patient is now agreeable to NG placement. Also agreeable for IV feraheme that was ordered this am.  Wende Mott, CNM 11/17/18 6:36 PM

## 2018-11-17 NOTE — Lactation Note (Signed)
This note was copied from a baby's chart. Lactation Consultation Note  Patient Name: Amanda Foley Today's Date: 11/17/2018  P2, 84 hour female infant. Per mom, she has been extremely sick due to hyperemesis . She has not been able to latch infant to breast and has been formula feeding infant. Mom's plan is to breastfed infant once emesis is resolved and would like to see [facility] services later today.  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    Maternal Data    Feeding Feeding Type: Bottle Fed - Formula Nipple Type: Nfant Slow Flow (purple)  LATCH Score                   Interventions    Lactation Tools Discussed/Used     Consult Status      Vicente Serene 11/17/2018, 6:14 AM

## 2018-11-17 NOTE — Progress Notes (Signed)
CNM to patient's room to discuss results of CT scan. Discussed with patient recommendation for NG tube for decompression of bowel. Patient declines stating she is starting to have more burping and a small amount of flatus. States she would rather wait and let this resolve on its own. Instructed patient on risks and benefits of waiting. Patient verbalized understanding and still declines. Will let team know if she desires NG for treatment.   Wende Mott, CNM 11/17/18

## 2018-11-17 NOTE — Progress Notes (Signed)
Patient given ativan and spray.  Up to void.  Feels rumblings in her belly and states "it feels softer."

## 2018-11-17 NOTE — Discharge Summary (Signed)
Obstetrics Discharge Summary OB/GYN Faculty Practice   Patient Name: Amanda Foley DOB: 03-07-1985 MRN: [id number]  Date of admission: 11/14/2018 Delivering MD: Florian Buff   Date of discharge: 11/18/2018  Admitting diagnosis: 42WKS WATER BROKE Intrauterine pregnancy: [redacted]w[redacted]d     Secondary diagnosis:   Active Problems:   Normal labor   Postoperative ileus ([place])   Additional problems:  1.  Intrauterine pregnancy at [redacted]w[redacted]d  weeks gestation 2.  Previous Caesarean section 3.  VBAC attempt at home 4.  Arrest of descent in 2nd stage of labor(6 hours) 5. Intra amniotic infection     Discharge diagnosis: Term Pregnancy Delivered                                            Postpartum procedures: None  Complications: Postpartum Ileus    Hospital course: Amanda Foley is a 33 y.o. [redacted]w[redacted]d who presented in active labor after attempting home VBAC with CPM, however SROM with [facility] which lead to transfer to [facility] to [facility]. Her pregnancy was complicated by postdates, meconium stained fluid, and previous c-section. Her labor course was notable for Pitocin, IUPC, and prolonged pushing with failure to descend.  Delivery was complicated by atonic uterus after delivery requiring TXA. Please see delivery/op note for additional details. Her postpartum course was complicated by persistent nausea and vomiting with KUB notable for signs of ileus. She was treated with a suppository x 1 with a successful bowel movement. Patient did have NG tube placed for a few hour.s. Subsequently, had additional bowel movements and was tolerating a soft diet. By day of discharge, she was passing flatus, urinating, eating and drinking without difficulty. Her pain was well-controlled, and she was discharged home with Ibuprofen (declined narcotics). She will follow-up in clinic in 1 weeks. Patient requested discharge home after transitioning to liquid/soft diet. Instructed patient to slowly advance diet,  continue bowel regimen, and return precautions.   Physical exam  Vitals:   11/17/18 2226 11/18/18 0530 11/18/18 0800 11/18/18 1455  BP: 131/68 120/67 124/81 130/83  Pulse: 89 90 95 98  Resp: 18 18 18 18   Temp: 97.7 F (36.5 C) 99.8 F (37.7 C)  97.6 F (36.4 C)  TempSrc:  Oral  Oral  SpO2: 98%  100% 100%  Weight:      Height:       General: alert, cooperative and no distress Chest: no respiratory distress Abdomen: Somewhat distended. Soft, appropriately tender near incision Uterine Fundus: firm, appropriately tender Extremities: No calf swelling or tenderness  No edema  Exam performed by Dr. Mathis Dad, DO on day of d/c.   Labs: Lab Results  Component Value Date   WBC 17.6 (H) 11/16/2018   HGB 7.5 (L) 11/16/2018   HCT 23.7 (L) 11/16/2018   MCV 80.3 11/16/2018   PLT 218 11/16/2018   CMP Latest Ref Rng & Units 11/14/2018  Glucose 70 - 99 mg/dL 147(H)  BUN 6 - 20 mg/dL 8  Creatinine 0.44 - 1.00 mg/dL 0.72  Sodium 135 - 145 mmol/L 132(L)  Potassium 3.5 - 5.1 mmol/L 4.1  Chloride 98 - 111 mmol/L 102  CO2 22 - 32 mmol/L 15(L)  Calcium 8.9 - 10.3 mg/dL 8.7(L)  Total Protein 6.5 - 8.1 g/dL 6.1(L)  Total Bilirubin 0.3 - 1.2 mg/dL 0.6  Alkaline Phos 38 - 126 U/L 159(H)  AST 15 -  41 U/L 39  ALT 0 - 44 U/L 22    Discharge instructions: Per After Visit Summary and "Baby and Me Booklet"  After visit meds:  Allergies as of 11/18/2018      Reactions   Almond (diagnostic)    Gluten Meal Other (See Comments)   Reaction: G. I.   Lactose Intolerance (gi) Other (See Comments)   Reaction: G.I.      Medication List    TAKE these medications   calcium carbonate 500 MG chewable tablet Commonly known as: TUMS - dosed in mg elemental calcium Chew 1 tablet by mouth as needed for indigestion or heartburn.   ferrous sulfate 325 (65 FE) MG tablet Take 1 tablet (325 mg total) by mouth 2 (two) times daily with a meal.   ibuprofen 800 MG tablet Commonly known as:  ADVIL Take 1 tablet (800 mg total) by mouth every 8 (eight) hours.   OVER THE COUNTER MEDICATION Take 1 tablet by mouth daily. Blood Builder   polyethylene glycol 17 g packet Commonly known as: MIRALAX / GLYCOLAX Take 17 g by mouth daily.   prenatal multivitamin Tabs tablet Take 1 tablet by mouth daily at 12 noon.   senna-docusate 8.6-50 MG tablet Commonly known as: Senokot-S Take 2 tablets by mouth daily. Start taking on: November 19, 2018       Postpartum contraception: Natural Family Planning Diet: Routine Diet Activity: Advance as tolerated. Pelvic rest for 6 weeks.   Follow-up Appt:No future appointments. Follow-up Visit:No follow-ups on file.  Newborn Data: Live born female  Birth Weight: 8 lb 14.5 oz (4040 g) APGAR: 8, 9  Newborn Delivery   Birth date/time: 11/15/2018 03:06:00 Delivery type: C-Section, Low Transverse Trial of labor: Yes C-section categorization: Repeat      Baby Feeding: Breast Disposition:home with mother  Marcy Siren, D.O. [facility] Family Medicine Fellow, [facility] for [facility], [facility] 11/18/2018, 3:03 PM

## 2018-11-17 NOTE — Progress Notes (Signed)
Called to room to evaluate Amanda Foley due to discomfort with NG tube.  She is visibly distraught due to the discomfort and increased nausea she is having with the NG tube. She feels like her anxiety over it is preventing her from sleeping. She reports feeling like her abdomen is less distended, and that she has passed gas multiple times in the last twelve hours. The ativan helped a little, but she is still nauseated and extremely anxious. She is tearful and is begging for the NG tube to come out so she can sleep.  I explained the purpose of an NG tube to decompress her bowel, and showed her her images on the computer, pointing out the distended loops of bowel.  She understands that removal of the NG tube may result in having to put it back in if her symptoms worsen or do not resolve. FOB at bedside and offering comfort. Risks of removing the NG tube were explained to the patient, and she voiced understanding.  NG tube was removed without difficulty. She reported her nausea much improved with removal of the NG tube.  Patient was advised that she could try small sips of water or suck on ice chips. We can advance her diet as tolerated, but I would advise to do so slowly as she continues to have some nausea. She was advised to make sure she is up and moving around as much as possible.

## 2018-11-17 NOTE — Progress Notes (Signed)
Notified Dr Caron Presume that patient is very anxious about the NG tube that was just placed for her ileus.  She has requested some cepecol spray for her throat, medication to help calm her (through her IV), and to speak to a doctor/midwife.  New orders will be put in and she will be visited soon.

## 2018-11-17 NOTE — Progress Notes (Signed)
Notified Dr Servando Snare about patient's high pain level and discomfort.  She is NPO because of NG tube.  New order given for toradol IV.

## 2018-11-17 NOTE — Progress Notes (Signed)
Subjective: Postpartum Day 2: Cesarean Delivery Patient reports nausea and vomiting unable to tolerate PO at this time. Patient has vomiting after eating. Reports abdominal distention.   Objective: Vital signs in last 24 hours: Temp:  [97.7 F (36.5 C)-98 F (36.7 C)] 98 F (36.7 C) (10/10 0504) Pulse Rate:  [92-97] 95 (10/10 0504) Resp:  [18-20] 20 (10/10 0504) BP: (120-133)/(75-85) 120/80 (10/10 0504) SpO2:  [98 %-100 %] 100 % (10/10 0504)  Physical Exam:  General: alert, cooperative and no distress  Abdomen: non-tender, distended  Lochia: appropriate Uterine Fundus: firm Incision: no significant drainage DVT Evaluation: No evidence of DVT seen on physical exam.  Recent Labs    11/15/18 0642 11/16/18 0906  HGB 8.9* 7.5*  HCT 28.0* 23.7*    Assessment/Plan: Status post Cesarean section. Postoperative course complicated by possible SBO  CT abd pelvis today.  Marcille Buffy DNP, CNM  11/17/18  11:21 AM

## 2018-11-17 NOTE — Progress Notes (Addendum)
POSTPARTUM PROGRESS NOTE  POD #2  Subjective:  Amanda Foley is a 33 y.o. [id number] s/p rLTCS at [redacted]w[redacted]d.  She reports she doing well. Continued to have nausea overnight with inability to tolerate PO. Abdomen very distended. Endorsed belching but denied any flatulence. KUB obtained that was notable for dilated loops of bowel, no obvious obstruction Patient given suppository with one small bowel movement overnight. Feels like she is needs to pass flatus but unable to. Endorses abdominal discomfort. Desires to go home today.  Objective: Blood pressure 120/80, pulse 95, temperature 98 F (36.7 C), temperature source Oral, resp. rate 20, height 5\' 2"  (1.575 m), weight 82.3 kg, last menstrual period 01/23/2018, SpO2 100 %, unknown if currently breastfeeding.  Physical Exam:  General: alert, cooperative and no distress Chest: no respiratory distress Heart:regular rate, distal pulses intact Abdomen: distended, mildly firm Uterine Fundus: firm, appropriately tender DVT Evaluation: No calf swelling or tenderness Extremities: moderate LE edema Skin: warm, dry; incision clean/dry/intact w/ honeycomb dressing in place  Recent Labs    11/15/18 0642 11/16/18 0906  HGB 8.9* 7.5*  HCT 28.0* 23.7*    Assessment/Plan: Amanda Foley is a 33 y.o. [id number] s/p rLTCS at [redacted]w[redacted]d for failure to descent.  POD#2 - Doing welll; Has remained afebrile, pain well controlled. H/H appropriate  Routine postpartum care  OOB, ambulated   Anemia: Hgb 7.5, asymptomatic  Start po ferrous sulfate BID  IV iron x1  Bowel Distention: KUB notable for dilated loops of bowel. S/p Doculax suppository x 1 with one small bowel movement. Still unable to pass flatus. Abdomen still significantly distended.  - given another suppository x 1 - monitor output, flatus - diet as tolerated  - chewing gum  - OOB  Contraception: NFP Feeding: Breast  Dispo: Plan for discharge 10/10 or 10/11.   LOS: 3 days   Mina Marble,  D.O. [place], PGY2 11/17/2018, 5:59 AM

## 2018-11-18 DIAGNOSIS — K9189 Other postprocedural complications and disorders of digestive system: Secondary | ICD-10-CM

## 2018-11-18 DIAGNOSIS — K567 Ileus, unspecified: Secondary | ICD-10-CM

## 2018-11-18 MED ORDER — POLYETHYLENE GLYCOL 3350 17 G PO PACK: 17.0000 g | PACK | Freq: Every day | ORAL | 0 refills | Status: DC

## 2018-11-18 MED ORDER — KETOROLAC TROMETHAMINE 30 MG/ML IJ SOLN
30.0000 mg | Freq: Once | INTRAMUSCULAR | Status: AC
  Administered 2018-11-18: 30 mg via INTRAVENOUS
  Filled 2018-11-18: qty 1

## 2018-11-18 MED ORDER — IBUPROFEN 800 MG PO TABS
800.0000 mg | ORAL_TABLET | Freq: Three times a day (TID) | ORAL | Status: DC
  Administered 2018-11-18: 800 mg via ORAL
  Filled 2018-11-18: qty 1

## 2018-11-18 MED ORDER — SENNOSIDES-DOCUSATE SODIUM 8.6-50 MG PO TABS: 2.0000 | ORAL_TABLET | ORAL | 0 refills | Status: DC

## 2018-11-18 MED ORDER — IBUPROFEN 800 MG PO TABS: 800.0000 mg | ORAL_TABLET | Freq: Three times a day (TID) | ORAL | 1 refills | Status: DC

## 2018-11-18 MED ORDER — FERROUS SULFATE 325 (65 FE) MG PO TABS: 325.0000 mg | ORAL_TABLET | Freq: Two times a day (BID) | ORAL | 3 refills | Status: DC

## 2018-11-18 MED ORDER — PROMETHAZINE HCL 25 MG/ML IJ SOLN
12.5000 mg | Freq: Once | INTRAMUSCULAR | Status: AC
  Administered 2018-11-18: 12.5 mg via INTRAVENOUS
  Filled 2018-11-18 (×2): qty 1

## 2018-11-18 NOTE — Progress Notes (Signed)
Pt has ate 2 cups of broth, jello, and pudding. Continues to drink fluids by mouth with no difficulty. Patient has no complaints of nausea or vomiting. Dr. Juleen China updated of condition and patient's wishes to be discharged home.

## 2018-11-18 NOTE — Progress Notes (Signed)
Updated Dr. Juleen China on patient status. Verbal order received for 800mg  motrin PO every 8 hours. Patient needs to continue to advance from clear liquid diet. Patient currently has held down all food and drink received today. Per provider, will continue to monitor intake and output in anticipation for discharge later this evening.   1310: updated patient on plan of care. Pt voiced frustration at this time. States " the hospital food makes me sick and that if I could go home I would be able to cook my own food and I would be fine". Explained to patient that we had to make sure that she could tolerate and hold down solid foods before she would be ready for discharge. Pt verbalized understanding and is currently eating pudding. Will continue to monitor.

## 2018-11-18 NOTE — Progress Notes (Addendum)
12.5 mg phenergan given to patient iv running with LR @ 189ml/hr, diluted in 10 ml NS, over 3.5 minutes for continued nausea. VS WNL.  Given at [phone]

## 2018-11-18 NOTE — Progress Notes (Signed)
Patient's iv alarm keeps going off because it is in her anticubital space and patient is now moving around in the room and every time she moves her arm it sounds.  Called Dr Darene Lamer, who prefers to keep iv fluids running until sure she can tolerate solids/liquids by mouth.  Discussed with patient.  Patient is okay with having it in, but wants it moved.  IV team has been called to place a new iv.

## 2018-11-18 NOTE — Progress Notes (Signed)
Upon entering room, patient is sitting up on couch and holding baby. Mom complains of no pain and has tolerated PO fluids and jello all morning. Instructed patient to try some broth at this time. D/c'd IV fluids and saline locked IV due to patient tolerating PO fluids. Mom has been up and voiding with no problems and reports having 6 bowel movements since NG tube was removed. No episodes of vomiting since NG tube removed. Vital signs WNL. Will continue to monitor.

## 2018-11-18 NOTE — Progress Notes (Signed)
Dr Darene Lamer informed patient just had two large bowel movements.

## 2018-11-18 NOTE — Progress Notes (Signed)
Dr Darene Lamer wants patient to start with jello, clear liquids and keep iv running until she can tolerate those foods w/o emesis within an hour.

## 2018-11-18 NOTE — Discharge Instructions (Signed)
Slowly advance your diet as tolerated at home. Continue a bowel regimen as prescribed. If you have significant vomiting or stop passing gas/having bowel movements please call the clinic or return to the hospital.    Postpartum Care After Cesarean Delivery This sheet gives you information about how to care for yourself from the time you deliver your baby to up to 6-12 weeks after delivery (postpartum period). Your health care provider may also give you more specific instructions. If you have problems or questions, contact your health care provider. Follow these instructions at home: Medicines  Take over-the-counter and prescription medicines only as told by your health care provider.  If you were prescribed an antibiotic medicine, take it as told by your health care provider. Do not stop taking the antibiotic even if you start to feel better.  Ask your health care provider if the medicine prescribed to you: ? Requires you to avoid driving or using heavy machinery. ? Can cause constipation. You may need to take actions to prevent or treat constipation, such as:  Drink enough fluid to keep your urine pale yellow.  Take over-the-counter or prescription medicines.  Eat foods that are high in fiber, such as beans, whole grains, and fresh fruits and vegetables.  Limit foods that are high in fat and processed sugars, such as fried or sweet foods. Activity  Gradually return to your normal activities as told by your health care provider.  Avoid activities that take a lot of effort and energy (are strenuous) until approved by your health care provider. Walking at a slow to moderate pace is usually safe. Ask your health care provider what activities are safe for you. ? Do not lift anything that is heavier than your baby or 10 lb (4.5 kg) as told by your health care provider. ? Do not vacuum, climb stairs, or drive a car for as long as told by your health care provider.  If possible, have someone  help you at home until you are able to do your usual activities yourself.  Rest as much as possible. Try to rest or take naps while your baby is sleeping. Vaginal bleeding  It is normal to have vaginal bleeding (lochia) after delivery. Wear a sanitary pad to absorb vaginal bleeding and discharge. ? During the first week after delivery, the amount and appearance of lochia is often similar to a menstrual period. ? Over the next few weeks, it will gradually decrease to a dry, yellow-brown discharge. ? For most women, lochia stops completely by 4-6 weeks after delivery. Vaginal bleeding can vary from woman to woman.  Change your sanitary pads frequently. Watch for any changes in your flow, such as: ? A sudden increase in volume. ? A change in color. ? Large blood clots.  If you pass a blood clot, save it and call your health care provider to discuss. Do not flush blood clots down the toilet before you get instructions from your health care provider.  Do not use tampons or douches until your health care provider says this is safe.  If you are not breastfeeding, your period should return 6-8 weeks after delivery. If you are breastfeeding, your period may return anytime between 8 weeks after delivery and the time that you stop breastfeeding. Perineal care   If your C-section (Cesarean section) was unplanned, and you were allowed to labor and push before delivery, you may have pain, swelling, and discomfort of the tissue between your vaginal opening and your anus (perineum). You  may also have an incision in the tissue (episiotomy) or the tissue may have torn during delivery. Follow these instructions as told by your health care provider: ? Keep your perineum clean and dry as told by your health care provider. Use medicated pads and pain-relieving sprays and creams as directed. ? If you have an episiotomy or vaginal tear, check the area every day for signs of infection. Check for:  Redness,  swelling, or pain.  Fluid or blood.  Warmth.  Pus or a bad smell. ? You may be given a squirt bottle to use instead of wiping to clean the perineum area after you go to the bathroom. As you start healing, you may use the squirt bottle before wiping yourself. Make sure to wipe gently. ? To relieve pain caused by an episiotomy, vaginal tear, or hemorrhoids, try taking a warm sitz bath 2-3 times a day. A sitz bath is a warm water bath that is taken while you are sitting down. The water should only come up to your hips and should cover your buttocks. Breast care  Within the first few days after delivery, your breasts may feel heavy, full, and uncomfortable (breast engorgement). You may also have milk leaking from your breasts. Your health care provider can suggest ways to help relieve breast discomfort. Breast engorgement should go away within a few days.  If you are breastfeeding: ? Wear a bra that supports your breasts and fits you well. ? Keep your nipples clean and dry. Apply creams and ointments as told by your health care provider. ? You may need to use breast pads to absorb milk leakage. ? You may have uterine contractions every time you breastfeed for several weeks after delivery. Uterine contractions help your uterus return to its normal size. ? If you have any problems with breastfeeding, work with your health care provider or a Advertising copywriterlactation consultant.  If you are not breastfeeding: ? Avoid touching your breasts as this can make your breasts produce more milk. ? Wear a well-fitting bra and use cold packs to help with swelling. ? Do not squeeze out (express) milk. This causes you to make more milk. Intimacy and sexuality  Ask your health care provider when you can engage in sexual activity. This may depend on your: ? Risk of infection. ? Healing rate. ? Comfort and desire to engage in sexual activity.  You are able to get pregnant after delivery, even if you have not had your  period. If desired, talk with your health care provider about methods of family planning or birth control (contraception). Lifestyle  Do not use any products that contain nicotine or tobacco, such as cigarettes, e-cigarettes, and chewing tobacco. If you need help quitting, ask your health care provider.  Do not drink alcohol, especially if you are breastfeeding. Eating and drinking   Drink enough fluid to keep your urine pale yellow.  Eat high-fiber foods every day. These may help prevent or relieve constipation. High-fiber foods include: ? Whole grain cereals and breads. ? Brown rice. ? Beans. ? Fresh fruits and vegetables.  Take your prenatal vitamins until your postpartum checkup or until your health care provider tells you it is okay to stop. General instructions  Keep all follow-up visits for you and your baby as told by your health care provider. Most women visit their health care provider for a postpartum checkup within the first 3-6 weeks after delivery. Contact a health care provider if you:  Feel unable to cope with the  changes that a new baby brings to your life, and these feelings do not go away.  Feel unusually sad or worried.  Have breasts that are painful, hard, or turn red.  Have a fever.  Have trouble holding urine or keeping urine from leaking.  Have little or no interest in activities you used to enjoy.  Have not breastfed at all and you have not had a menstrual period for 12 weeks after delivery.  Have stopped breastfeeding and you have not had a menstrual period for 12 weeks after you stopped breastfeeding.  Have questions about caring for yourself or your baby.  Pass a blood clot from your vagina. Get help right away if you:  Have chest pain.  Have difficulty breathing.  Have sudden, severe leg pain.  Have severe pain or cramping in your abdomen.  Bleed from your vagina so much that you fill more than one sanitary pad in one hour. Bleeding  should not be heavier than your heaviest period.  Develop a severe headache.  Faint.  Have blurred vision or spots in your vision.  Have a bad-smelling vaginal discharge.  Have thoughts about hurting yourself or your baby. If you ever feel like you may hurt yourself or others, or have thoughts about taking your own life, get help right away. You can go to your nearest emergency department or call:  Your local emergency services (911 in the [place]  A suicide crisis helpline, such as the [place] at 780-246-0841. This is open 24 hours a day. Summary  The period of time from when you deliver your baby to up to 6-12 weeks after delivery is called the postpartum period.  Gradually return to your normal activities as told by your health care provider.  Keep all follow-up visits for you and your baby as told by your health care provider. This information is not intended to replace advice given to you by your health care provider. Make sure you discuss any questions you have with your health care provider. Document Released: 01/22/2000 Document Revised: 09/13/2017 Document Reviewed: 09/13/2017 Elsevier Patient Education  2020 [employer].

## 2018-11-18 NOTE — Progress Notes (Signed)
Charge RN went to patient's room to discuss patient's request to remove NG tube. RN explained to patient that the NG tube was helping with her distended stomach. Patient expressed understanding and says that she feels her body is on the mend and the NG tube is not necessary. RN called on-call doctor to come and talk with the patient about the NG tube.

## 2018-11-18 NOTE — Progress Notes (Signed)
  POSTPARTUM PROGRESS NOTE  Subjective: Amanda Foley is a 33 y.o. [id number] POD#3 s/p repeat Cesarean section at [redacted]w[redacted]d.  She reports she is passing flatus and has had 4 large bowel movements over the last 7 hours. She denies any problems with ambulating or voiding. She has been tolerating clears, and would like to try Jello at breakfast. Nausea controlled with Phenergan, no episodes of vomiting since NG tube removed. Pain is well controlled.  Lochia is appropriate.  Objective: Blood pressure 120/67, pulse 90, temperature 99.8 F (37.7 C), temperature source Oral, resp. rate 18, height 5\' 2"  (1.575 m), weight 82.3 kg, last menstrual period 01/23/2018, SpO2 98 %, unknown if currently breastfeeding.  Physical Exam:  General: alert, cooperative and no distress Chest: no respiratory distress Abdomen: Somewhat distended. Soft, appropriately tender near incision Uterine Fundus: firm, appropriately tender Extremities: No calf swelling or tenderness  No edema  Recent Labs    11/16/18 0906  HGB 7.5*  HCT 23.7*    Assessment/Plan: Amanda Foley is a 33 y.o. [id number] s/p repeat Cesarean delivery at [redacted]w[redacted]d for failed TOLAC.  Routine Postpartum Care:  -- Continue routine care, lactation support  -- Post-Op ileus: has had 4 bowel movements and is passing flatus. Abdomen is less distended. Had NG tube for ~4-6 hours last night  Dispo: Plan for discharge either later this afternoon if tolerating diet or tomorrow.  Merilyn Baba, DO OB/GYN Fellow, [facility] for [facility]

## 2018-11-18 NOTE — Lactation Note (Signed)
This note was copied from a baby's chart. Lactation Consultation Note  Patient Name: Amanda Foley Today's Date: 11/18/2018    [facility] Follow Up Visit:  P2 mother whose infant is now 30 hours old.  Baby was asleep on father's chest when I arrived.  Mother has been very sick since delivery; much nausea and emesis.  She has been feeling better today and is being observed.  Family would like a discharge later today.  Mother has been giving formula for over 2 days now.  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  She explained in detail about the ties and informed me that she is so familiar with this concept because her other child (now 40 years old) also had the same issue.  Mother pursued for 11 weeks with her first child before having a revision done.  Mother currently has an appointment for tomorrow to see Dr. Audie Pinto, whom she spoke very highly of.    Mother has not been pumping but informed me that she will start tomorrow.  I suggested she start today if she is discharged and mother is not sure she will feel good enough.  Encouraged her to continue using her manual pump and hand expression and feeding back any EBM she obtains to baby.  Mother verbalized understanding.  Discussed making an OP LC visit with Ivin Booty for after the revision.  Mother has another [place] that she plans on visiting after the revision.  Provided our OP [place] number if she needs more assistance.  She also has our OP phone number for questions after discharge.  Father present and supportive.  RN updated.    Consult Status Consult Status: Complete Date: 11/18/18 Follow-up type: Call as needed    Bertis Hustead R Adella Manolis 11/18/2018, 1:01 PM

## 2019-01-02 ENCOUNTER — Encounter: Payer: Self-pay | Admitting: Obstetrics and Gynecology

## 2019-01-02 ENCOUNTER — Ambulatory Visit (INDEPENDENT_AMBULATORY_CARE_PROVIDER_SITE_OTHER): Payer: Self-pay | Admitting: Obstetrics and Gynecology

## 2019-01-02 ENCOUNTER — Other Ambulatory Visit: Payer: Self-pay

## 2019-01-02 DIAGNOSIS — Z1389 Encounter for screening for other disorder: Secondary | ICD-10-CM

## 2019-01-02 NOTE — Progress Notes (Signed)
   Post Partum Exam  Amanda Foley is a 33 y.o. [id number] female who presents for a postpartum visit. She is 6 .6 weeks postpartum following a low cervical transverse Cesarean section. I have fully reviewed the prenatal and intrapartum course. The delivery was at 108 gestational weeks. She attempted a home VBAC with a CPM. She was then transferred to the hospital after her water broke and it was meconium stained fluid. See delivery record for delivery details.  Anesthesia: epidural. Postpartum course has been uncomplicated since she was d/c'd home. She did, however, have a postoperative ileus. Baby's course has been uncomplicated; weighing over 8.5 lbs. Baby is feeding by breast. Bleeding staining only. Bowel function is normal. Bladder function is normal. Patient is not sexually active. Contraception method is none. Postpartum depression screening:neg; score 3.  The following portions of the patient's history were reviewed and updated as appropriate: allergies, current medications, past family history, past medical history, past social history, past surgical history and problem list. Last pap smear done 11/2016 and was Normal per pt  Review of Systems Constitutional: negative Eyes: negative Ears, nose, mouth, throat, and face: negative Respiratory: negative Cardiovascular: negative Gastrointestinal: negative Genitourinary:negative Integument/breast: negative Hematologic/lymphatic: negative Musculoskeletal:negative Neurological: negative Behavioral/Psych: negative Endocrine: negative Allergic/Immunologic: negative    Objective:  Last menstrual period 01/23/2018, unknown if currently breastfeeding.  General:  alert, cooperative and no distress   Breasts:  inspection negative, no nipple discharge or bleeding, no masses or nodularity palpable  Lungs: clear to auscultation bilaterally  Heart:  regular rate and rhythm, S1, S2 normal, no murmur, click, rub or gallop  Abdomen: soft, non-tender;  bowel sounds normal; no masses,  no organomegaly incision well-healed   Vulva:  not evaluated  Vagina: not evaluated  Cervix:  not evaluated  Corpus: not examined  Adnexa:  not evaluated  Rectal Exam: Not performed.        Assessment:  1) Encounter for postpartum care of lactating mother  Normal postpartum exam. Pap smear not done at today's visit.   2) Cesarean delivery delivered   Plan:   1. Contraception: none 2. Annual exam with pap in 1 year 3. Follow up in: 1 year or as needed.   Laury Deep, CNM  01/02/2019

## 2019-09-30 ENCOUNTER — Ambulatory Visit: Payer: Self-pay | Admitting: Internal Medicine

## 2020-12-29 IMAGING — CT CT ABD-PELV W/ CM
2 of 4 series · 16 of 46 positions shown, 18 images · IV contrast (APPLIED)
Comparison: None.

CLINICAL DATA: Postpartum abdominal pain and distention.

EXAM:
CT ABDOMEN AND PELVIS WITH CONTRAST
TECHNIQUE: Multidetector CT imaging of the abdomen and pelvis was performed
using the standard protocol following bolus administration of
intravenous contrast.
CONTRAST:  100mL OMNIPAQUE IOHEXOL 300 MG/ML  SOLN

[Series 3: abd/ pelvis 5.0 i30f 2 · axial · 0.86mm/px · z∈[+720,+1096]mm · 13 of 83 slices shown, 15 images]
[im 4/83  soft-tissue]
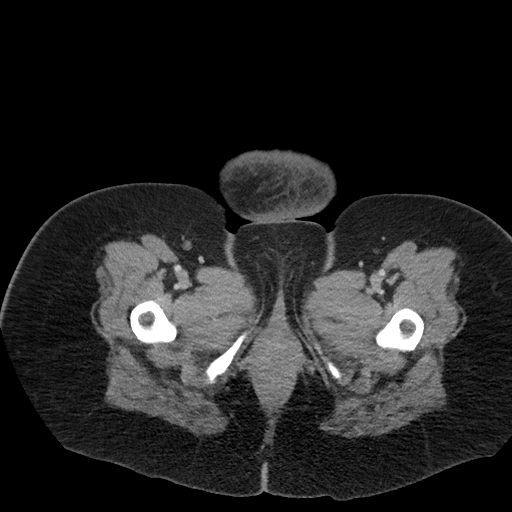
[im 4/83  bone]
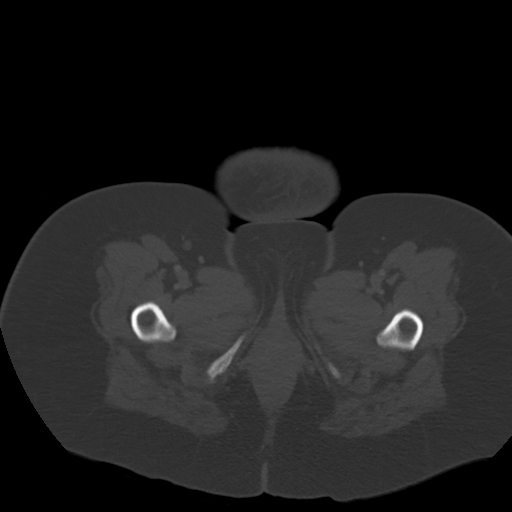
[im 10/83  soft-tissue]
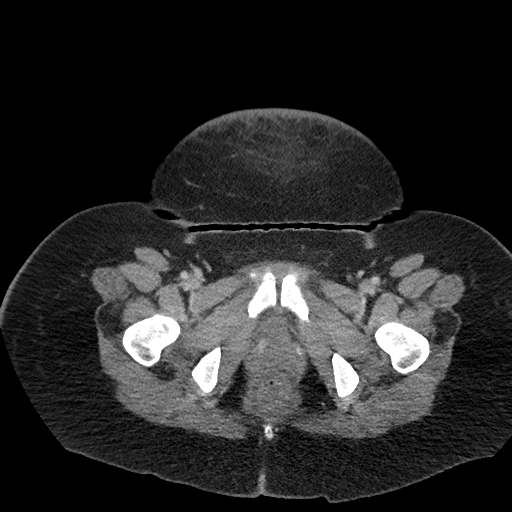
[im 16/83  soft-tissue]
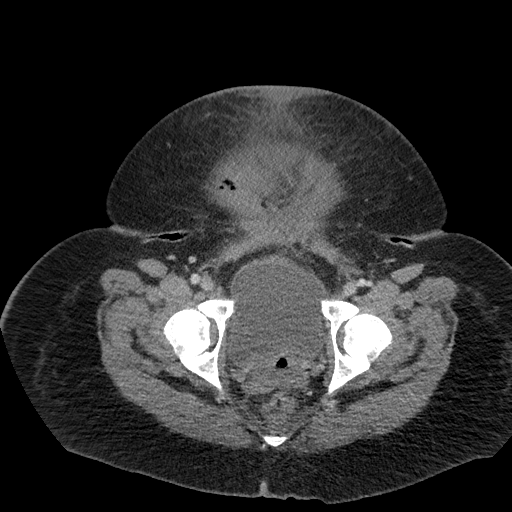
[im 23/83  soft-tissue]
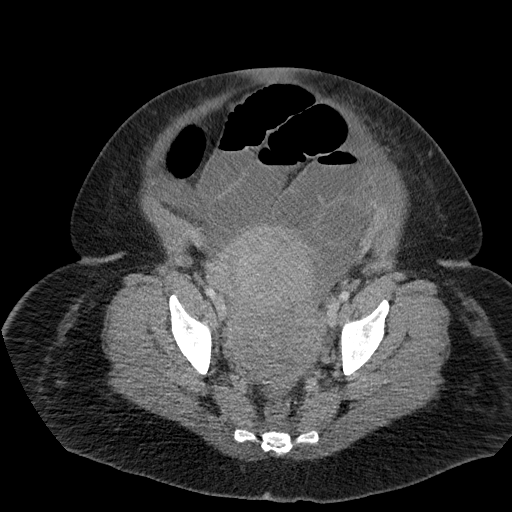
[im 29/83  soft-tissue]
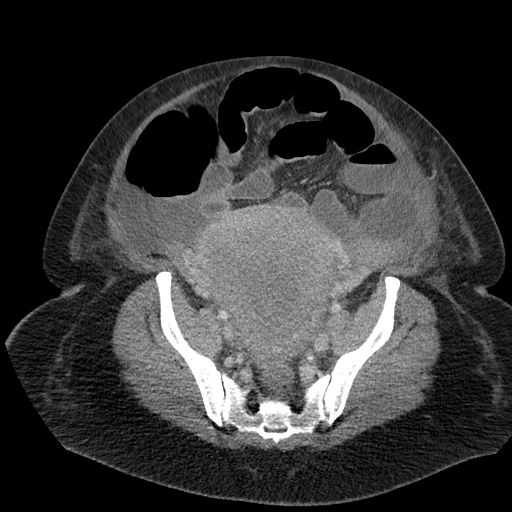
[im 35/83  soft-tissue]
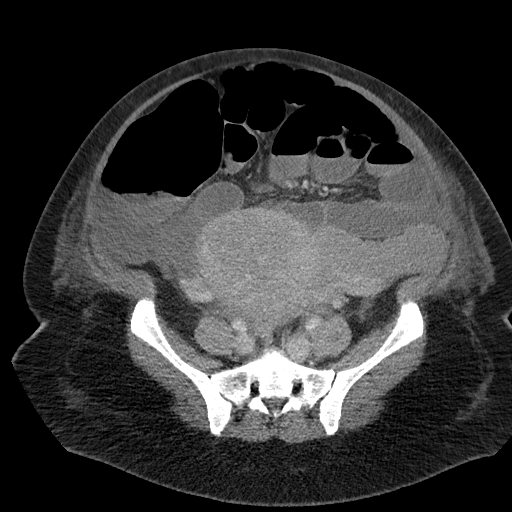
[im 42/83  soft-tissue]
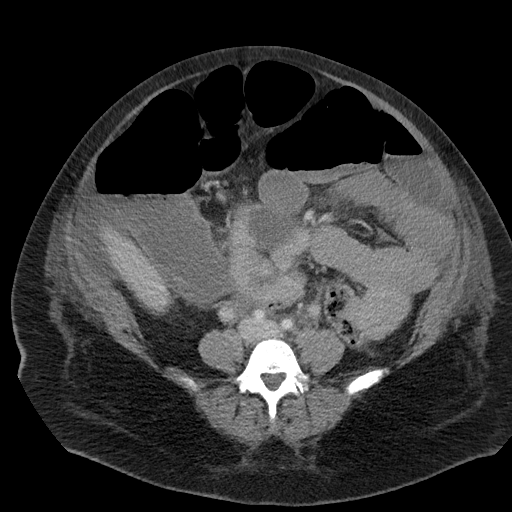
[im 48/83  soft-tissue]
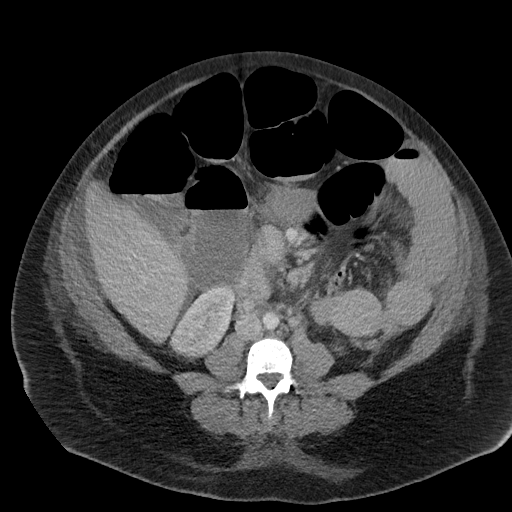
[im 54/83  soft-tissue]
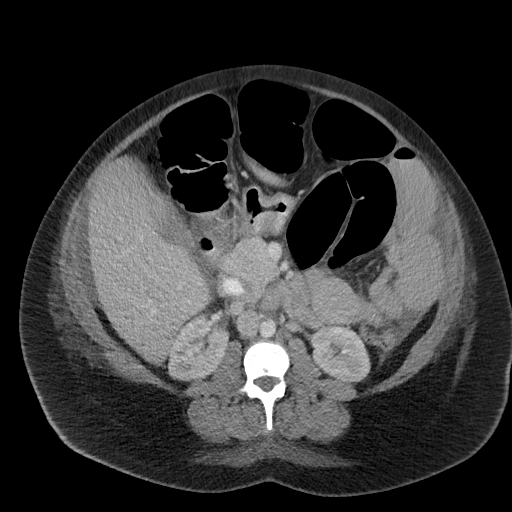
[im 54/83  bone]
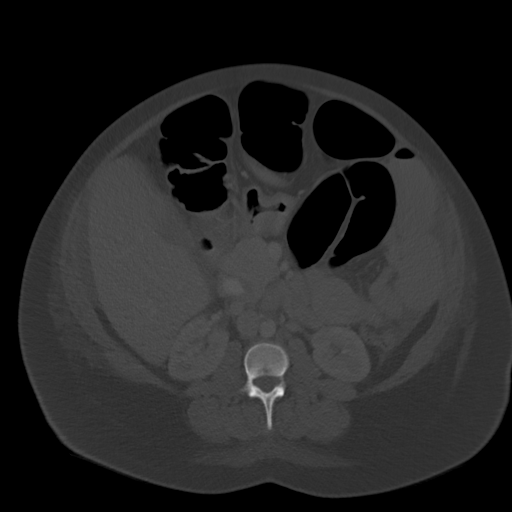
[im 60/83  soft-tissue]
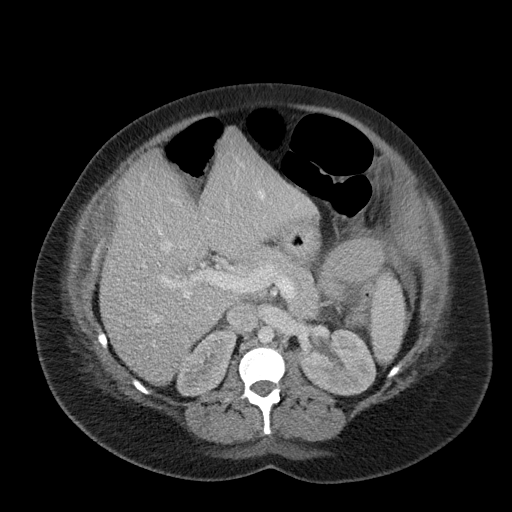
[im 67/83  soft-tissue]
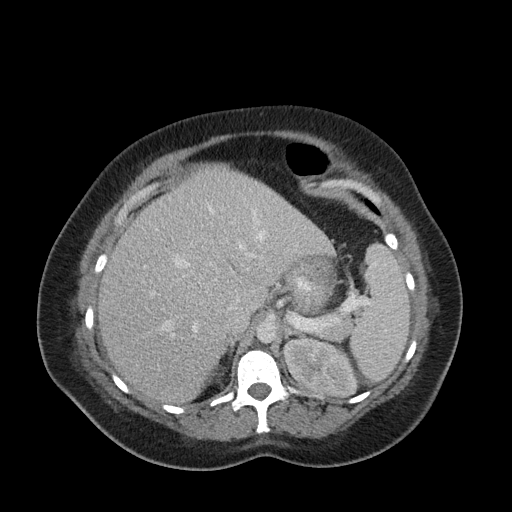
[im 73/83  soft-tissue]
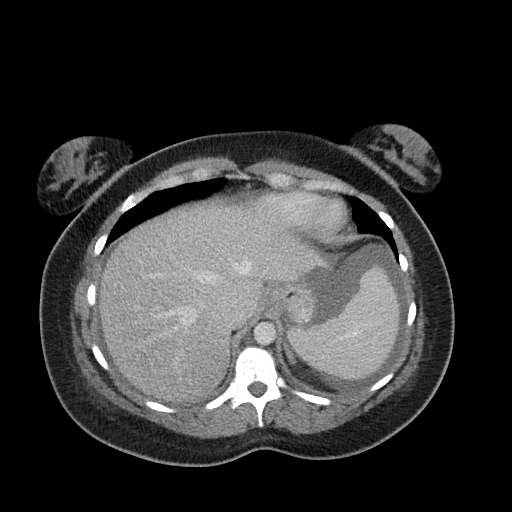
[im 79/83  soft-tissue]
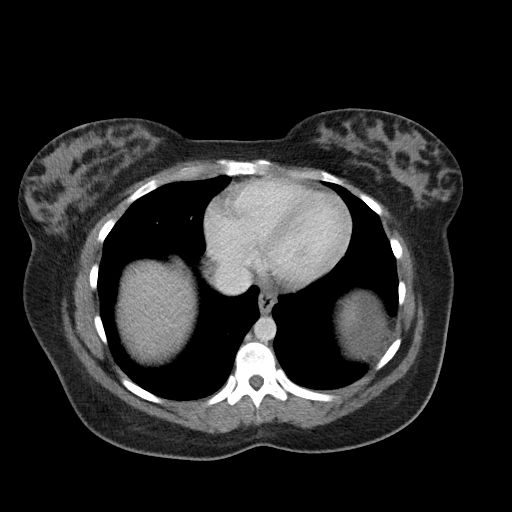

[Series 6: coronal soft tissue · coronal · 0.81mm/px · 3 of 141 slices shown]
[im 47/141  soft-tissue]
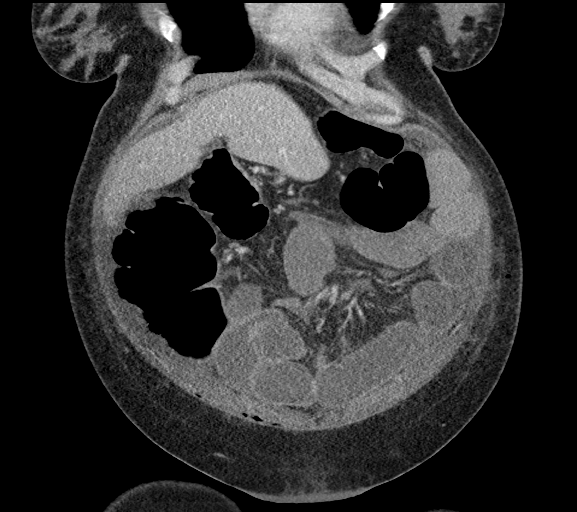
[im 63/141  soft-tissue]
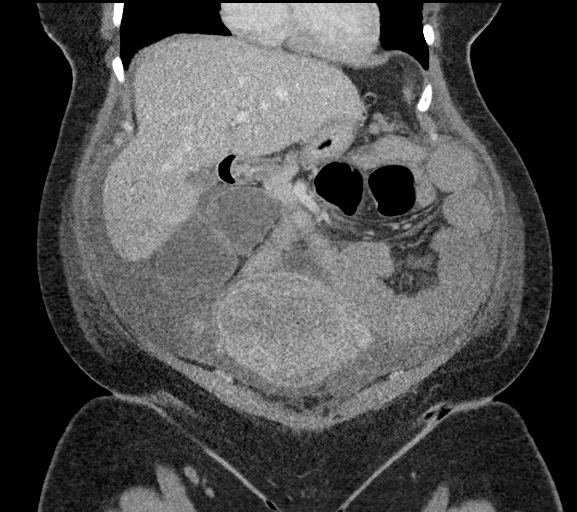
[im 78/141  soft-tissue]
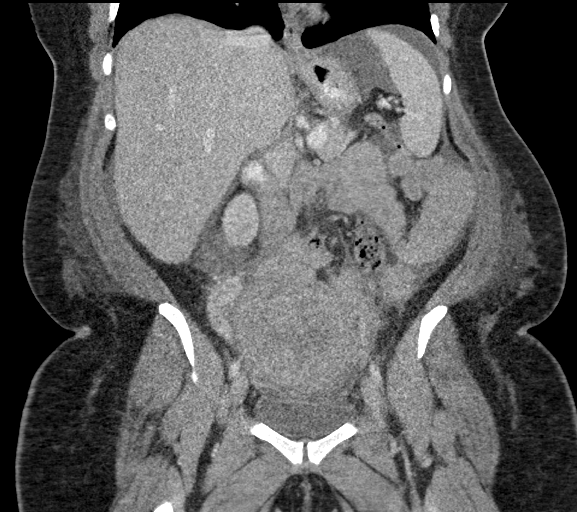

[16 of 46 positions shown; findings below may reference images not displayed]

FINDINGS: Lower chest: No acute abnormality.

Hepatobiliary: No focal liver abnormality is seen. No gallstones,
gallbladder wall thickening, or biliary dilatation.

Pancreas: Unremarkable. No pancreatic ductal dilatation or
surrounding inflammatory changes.

Spleen: Normal in size without focal abnormality.

Adrenals/Urinary Tract: Adrenal glands are unremarkable. Kidneys are
normal, without renal calculi, focal lesion, or hydronephrosis.
Bladder is unremarkable.

Stomach/Bowel: The stomach is unremarkable. Small bowel dilatation
is noted as well as dilatation of the right and transverse colon.
This may simply represent ileus, but obstruction cannot be excluded.

Vascular/Lymphatic: No significant vascular findings are present. No
enlarged abdominal or pelvic lymph nodes.

Reproductive: Enlarged uterus is noted consistent with postpartum
status. No definite adnexal abnormality is noted.

Other: Mild amount of free fluid is noted in the pelvis and upper
abdomen around the spleen and liver which may be postoperative in
etiology.

Musculoskeletal: No acute or significant osseous findings.
IMPRESSION: Enlarged uterus is noted consistent with post cesarean section
status. Large and small bowel dilatation is noted which may be due
to postoperative ileus, but distal obstruction cannot be excluded.

Mild amount of free fluid is noted in the pelvis and upper abdomen
around the liver and spleen which may be postoperative in etiology.

## 2020-12-29 IMAGING — DX DG ABDOMEN 1V
2 series · 2 of 2 positions shown · non-contrast
Comparison: None.

CLINICAL DATA: Abdominal distension and vomiting

EXAM:
ABDOMEN - 1 VIEW

[abdomen kub (1 of 2)]
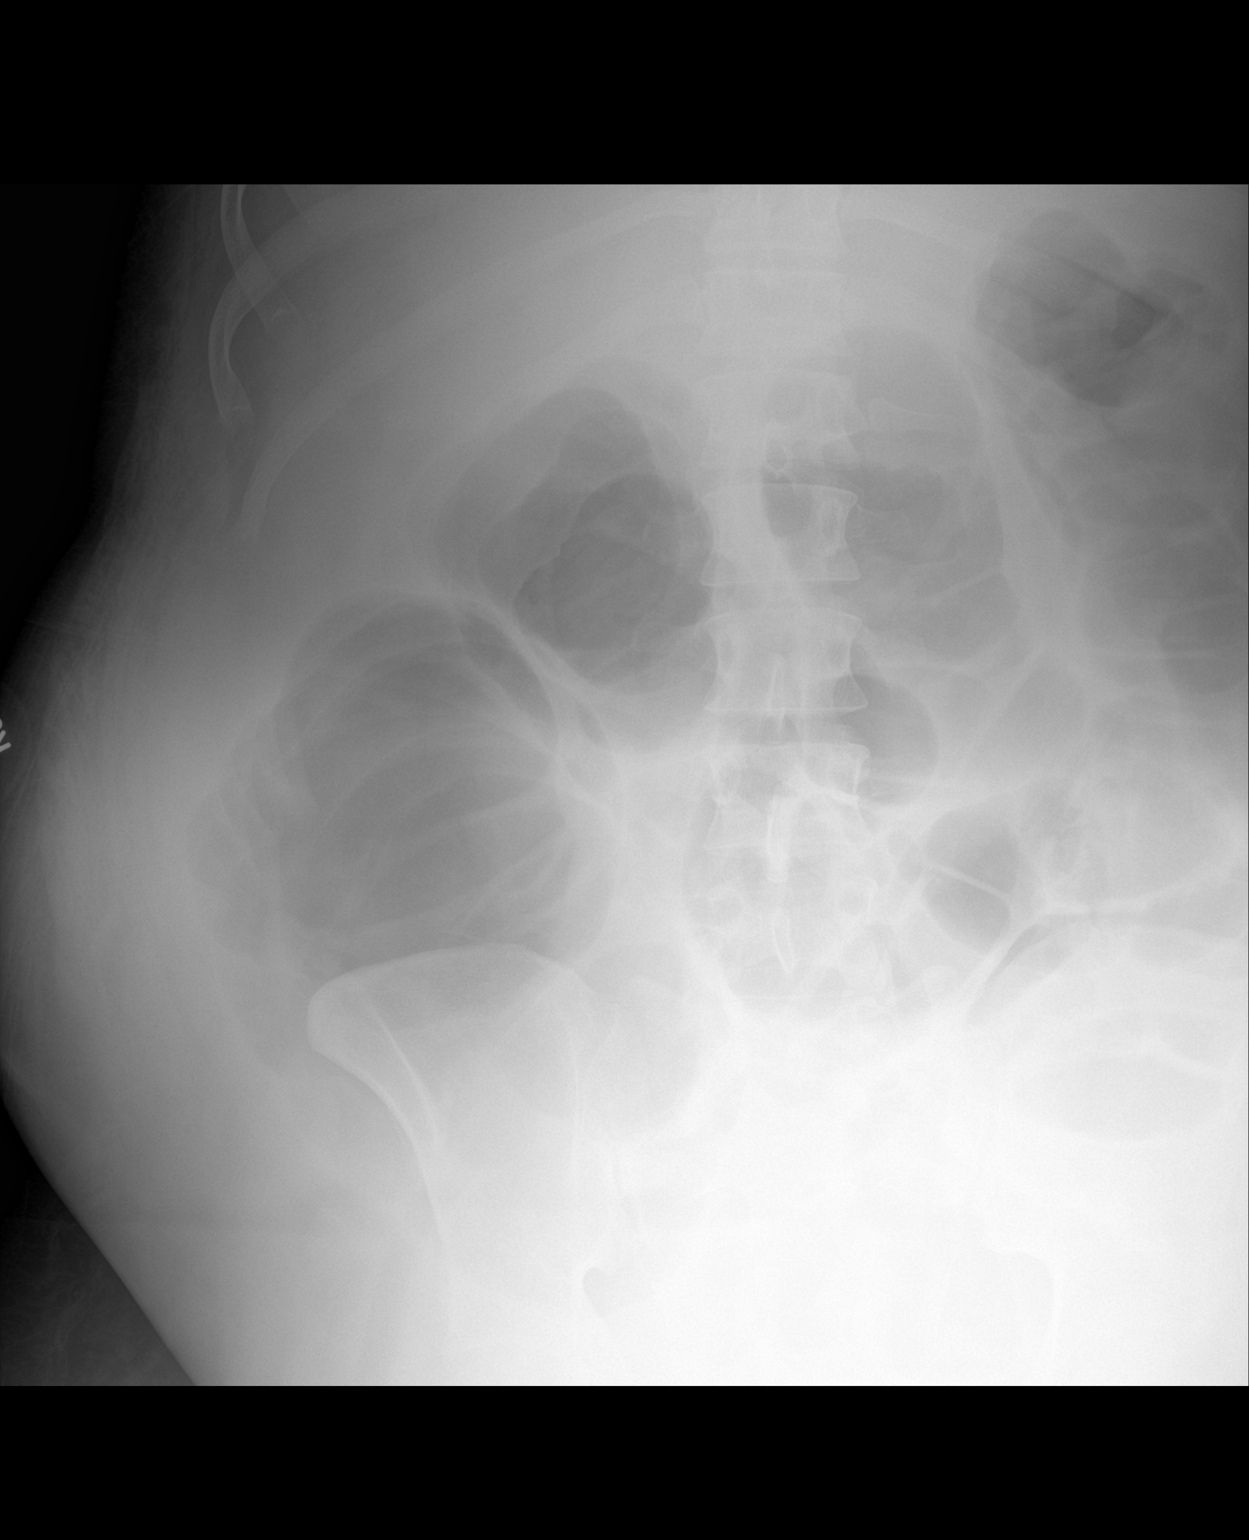

[abdomen kub (2 of 2)]
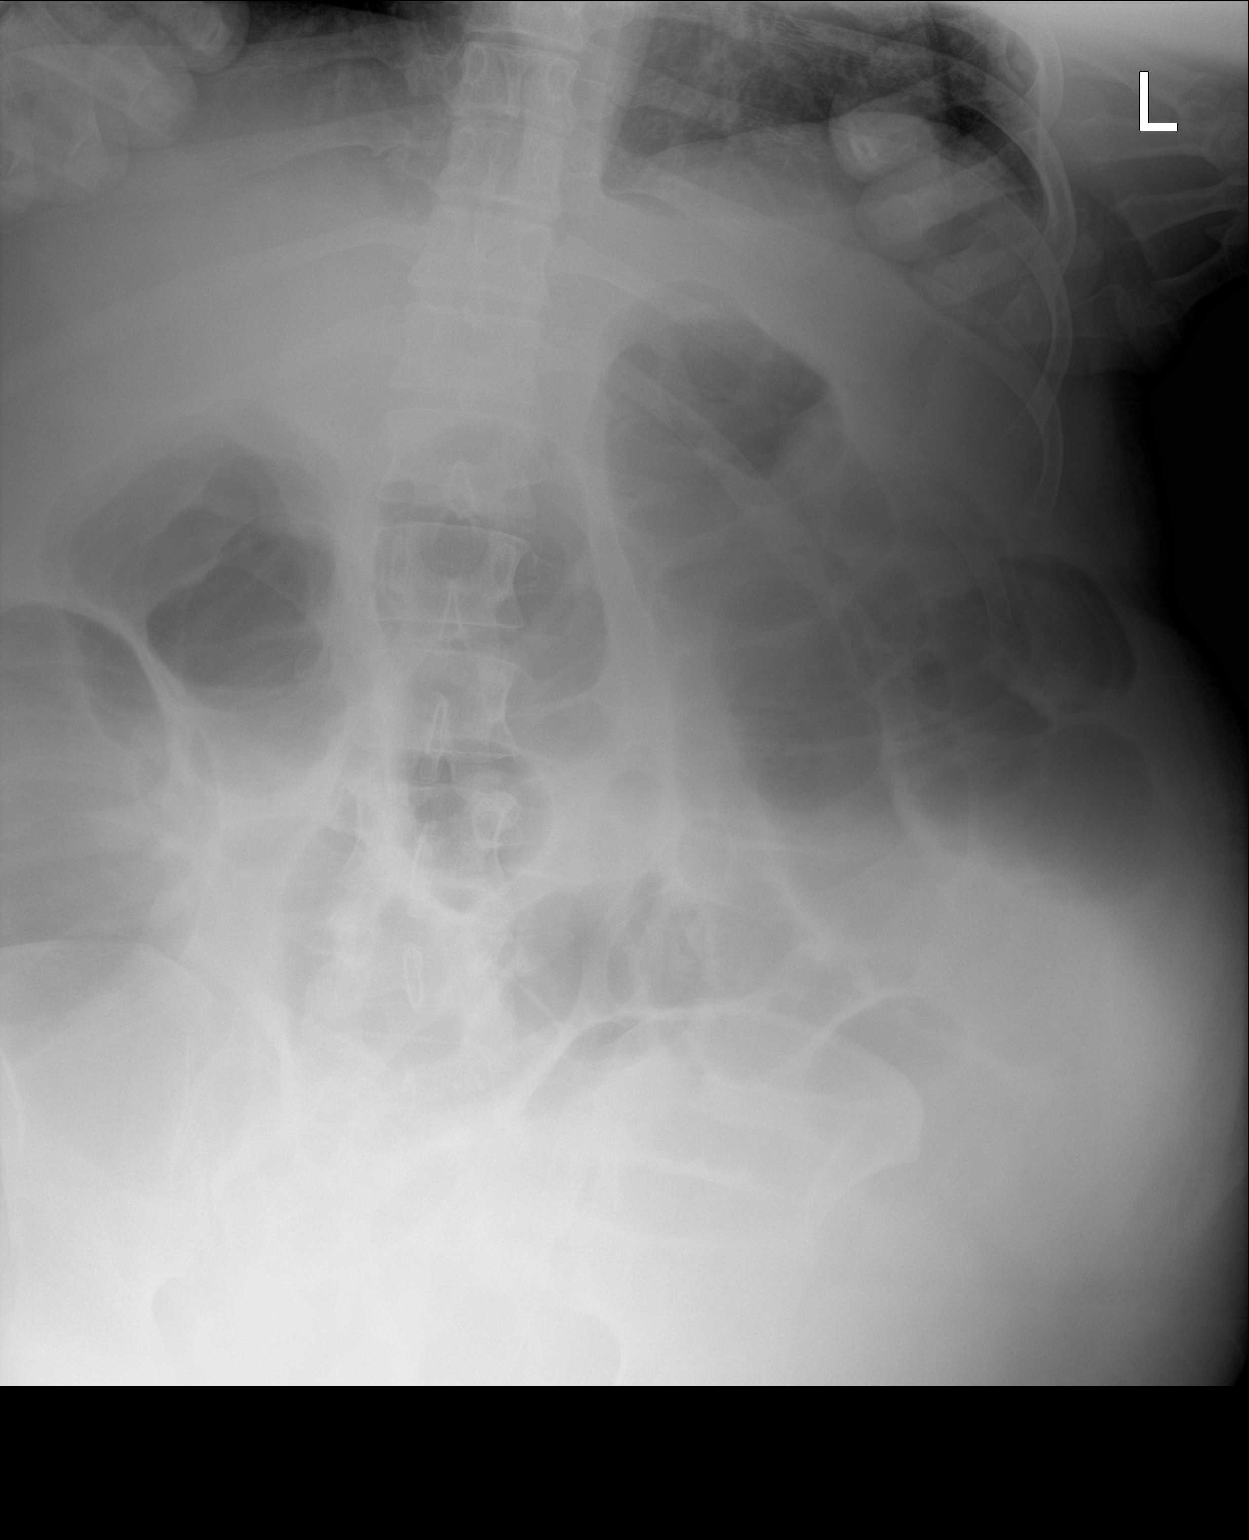

[2 of 2 positions shown; findings below may reference images not displayed]

FINDINGS: There are dilated loops of gas-filled small and large bowel
throughout the abdomen.
IMPRESSION: Dilated gas-filled bowel throughout the abdomen, possibly indicating
small bowel obstruction. Correlation with CT abdomen pelvis
recommended.

## 2022-07-27 ENCOUNTER — Ambulatory Visit (INDEPENDENT_AMBULATORY_CARE_PROVIDER_SITE_OTHER): Payer: Self-pay | Admitting: Licensed Practical Nurse

## 2022-07-27 ENCOUNTER — Encounter: Payer: Self-pay | Admitting: Licensed Practical Nurse

## 2022-07-27 VITALS — BP 118/75 | HR 100 | Wt 233.6 lb

## 2022-07-27 DIAGNOSIS — O99212 Obesity complicating pregnancy, second trimester: Secondary | ICD-10-CM

## 2022-07-27 DIAGNOSIS — O09899 Supervision of other high risk pregnancies, unspecified trimester: Secondary | ICD-10-CM

## 2022-07-27 DIAGNOSIS — O0992 Supervision of high risk pregnancy, unspecified, second trimester: Secondary | ICD-10-CM

## 2022-07-27 DIAGNOSIS — O163 Unspecified maternal hypertension, third trimester: Secondary | ICD-10-CM

## 2022-07-27 DIAGNOSIS — O162 Unspecified maternal hypertension, second trimester: Secondary | ICD-10-CM

## 2022-07-27 DIAGNOSIS — Z131 Encounter for screening for diabetes mellitus: Secondary | ICD-10-CM

## 2022-07-27 DIAGNOSIS — O34219 Maternal care for unspecified type scar from previous cesarean delivery: Secondary | ICD-10-CM

## 2022-07-27 DIAGNOSIS — Z3A27 27 weeks gestation of pregnancy: Secondary | ICD-10-CM

## 2022-07-27 LAB — POCT URINALYSIS DIPSTICK
Bilirubin, UA: NEGATIVE
Blood, UA: NEGATIVE
Glucose, UA: NEGATIVE
Ketones, UA: NEGATIVE
Leukocytes, UA: NEGATIVE
Nitrite, UA: NEGATIVE
Protein, UA: NEGATIVE
Spec Grav, UA: 1.02 (ref 1.010–1.025)
Urobilinogen, UA: 0.2 E.U./dL
pH, UA: 6.5 (ref 5.0–8.0)

## 2022-07-28 MED ORDER — ACCU-CHEK GUIDE VI STRP: 1.0000 | ORAL_STRIP | Freq: Four times a day (QID) | 6 refills | Status: DC

## 2022-07-28 MED ORDER — ACCU-CHEK GUIDE W/DEVICE KIT: 1.0000 | PACK | Freq: Four times a day (QID) | 0 refills | Status: DC

## 2022-07-28 MED ORDER — ACCU-CHEK SOFTCLIX LANCETS MISC: 1.0000 | Freq: Four times a day (QID) | 12 refills | Status: DC

## 2022-07-28 NOTE — Progress Notes (Signed)
New Obstetric Patient H&P    Chief Complaint: "Desires prenatal care"  Tamey started her prenatal care at [facility]. She was abruptly released from their practice. She has had c/s x 2 and desires a VBAC.  Pt had an epidural with both pregnancies. She wonders if she would have been able to get into a more ideal position (pt wanted to get to hands and knees but was too numb) if she could have had a vaginal birth. Pt would like to try for a VBAC, she understands that she may still end up getting an epidural and may need a c-section.     History of Present Illness: Patient is a 37 y.o. [id number] Not Hispanic or Latino female, presents with amenorrhea and positive home pregnancy test. Patient's last menstrual period was 01/14/2022. and based on her  LMP, her EDD is Estimated Date of Delivery: 10/21/22 and her EGA is [redacted]w[redacted]d. Her last pap was in 2019 no abnormalities.   Pertinent medical history  BMI 41 Denies hx of CHTN, depression, anxiety    OB history 1) 08/16/2016: Girl 7lbs 14 oz, 41.3 wks, PLTCS, labored at [facility] birth center then transferred for epidural, did not dilate past 7cm., gained 40lbs, breastfed less than 1 year  2)11/17/2018: Girl, 8lbs 14oz, 42.2, RLTCS.  Received prenatal care with home birth midwife, labored at home, transferred d/t meconium, got to 10cm and pushed for 4 hours-arrest of decent. Was given TXA and methergine. Intra amniotic infection noted in chart  Had a postop ileus. Gained 30 lbs. Breastfeed longer than 1 year.   3) current pregnancy, had early 1 hour 131   Denies hx of GDM   Since her LMP, she admits to the use of tobacco products  no She claims she has gained    12  pounds since the start of her pregnancy.  She admits close contact with children on a regular basis  no  She has had chicken pox in the past unknown She has had Tuberculosis exposures, symptoms, or previously tested positive for TB   no Current or past history of domestic violence.  no  Genetic Screening/Teratology Counseling: (Includes patient, baby's father, or anyone in either family with:)   1. Patient's age >/= 60 at [facility]  yes 2. Thalassemia ([place], [place], Mediterranean, or Asian background): MCV<80  no 3. Neural tube defect (meningomyelocele, spina bifida, anencephaly)  no 4. Congenital heart defect  no  5. Down syndrome  no 6. Tay-Sachs (Jewish, [place]  no 7. Canavan's Disease  no 8. Sickle cell disease or trait (African)  no  9. Hemophilia or other blood disorders  no  10. Muscular dystrophy  no  11. Cystic fibrosis  no  12. Huntington's Chorea  no  13. Mental retardation/autism  no 14. Other inherited genetic or chromosomal disorder  no 15. Maternal metabolic disorder (DM, PKU, etc)  no 16. Patient or FOB with a child with a birth defect not listed above no  16a. Patient or FOB with a birth defect themselves no 17. Recurrent pregnancy loss, or stillbirth  no  18. Any medications since LMP other than prenatal vitamins (include vitamins, supplements, OTC meds, drugs, alcohol)  no 19. Any other genetic/environmental exposure to discuss  no  Infection History:   1. Lives with someone with TB or TB exposed  no  2. Patient or partner has history of genital herpes  no, but does get cold sores on her lips (oral) 3. Rash or viral illness since  LMP  no 4. History of STI (GC, CT, HPV, syphilis, HIV)  no 5. History of recent travel :  no  Other pertinent information:  yes  Is a SAHM Lives with her husband and daughters, feels safe  Exercise:walks most days    Review of Systems:10 point review of systems negative unless otherwise noted in HPI  Past Medical History:  Patient Active Problem List   Diagnosis Date Noted   Postoperative ileus (HCC) 11/18/2018   Normal labor 11/14/2018   Cesarean delivery delivered 08/17/2016    Past Surgical History:  Past Surgical History:  Procedure Laterality Date   CESAREAN SECTION N/A 08/16/2016    Procedure: CESAREAN SECTION;  Surgeon: Olivia Mackie, MD;  Location: [facility] BIRTHING SUITES;  Service: Obstetrics;  Laterality: N/A;   CESAREAN SECTION     CESAREAN SECTION N/A 11/15/2018   Procedure: CESAREAN SECTION;  Surgeon: Lazaro Arms, MD;  Location: [facility] LD ORS;  Service: Obstetrics;  Laterality: N/A;   WISDOM TOOTH EXTRACTION      Gynecologic History: Patient's last menstrual period was 01/14/2022.  Obstetric History: [id number]  Family History:  No family history on file.  Social History:  Social History   Socioeconomic History   Marital status: Married    Spouse name: Not on file   Number of children: Not on file   Years of education: Not on file   Highest education level: Not on file  Occupational History   Not on file  Tobacco Use   Smoking status: Never   Smokeless tobacco: Never  Vaping Use   Vaping Use: Never used  Substance and Sexual Activity   Alcohol use: No   Drug use: No   Sexual activity: Yes    Birth control/protection: None  Other Topics Concern   Not on file  Social History Narrative   Not on file   Social Determinants of Health   Financial Resource Strain: Not on file  Food Insecurity: Not on file  Transportation Needs: No Transportation Needs (03/07/2018)   PRAPARE - Transportation    Lack of Transportation (Medical): No    Lack of Transportation (Non-Medical): No  Physical Activity: Not on file  Stress: Not on file  Social Connections: Unknown (03/07/2018)   Social Connection and Isolation Panel [NHANES]    Frequency of Communication with Friends and Family: Not on file    Frequency of Social Gatherings with Friends and Family: Not on file    Attends Religious Services: Not on file    Active Member of Clubs or Organizations: Not on file    Attends Banker Meetings: Not on file    Marital Status: Married  Intimate Partner Violence: Not At Risk (03/07/2018)   Humiliation, Afraid, Rape, and Kick questionnaire    Fear of Current  or Ex-Partner: No    Emotionally Abused: No    Physically Abused: No    Sexually Abused: No    Allergies:  Allergies  Allergen Reactions   Almond (Diagnostic)    Gluten Meal Other (See Comments)    Reaction: G. I.   Lactose Intolerance (Gi) Other (See Comments)    Reaction: G.I.    Medications: Prior to Admission medications   Medication Sig Start Date End Date Taking? Authorizing Provider  Accu-Chek Softclix Lancets lancets 1 each by Other route 4 (four) times daily. Check fasting, and then 1-2 hours after every meal 07/28/22  Yes Priyansh Pry, Courtney Heys, CNM  Blood Glucose Monitoring Suppl (ACCU-CHEK GUIDE) w/Device KIT 1 kit  by Does not apply route in the morning, at noon, in the evening, and at bedtime. Check blood sugars for fasting, and two hours after breakfast, lunch and dinner (4 checks daily) 07/28/22  Yes Ronaldo Crilly, Courtney Heys, CNM  glucose blood (ACCU-CHEK GUIDE) test strip 1 each by Other route in the morning, at noon, in the evening, and at bedtime. Check blood glucose four times a day: fasting and 2 hours after breakfast, lunch, and dinner 07/28/22  Yes Jayd Forrey, Courtney Heys, CNM  Prenatal Vit-Fe Fumarate-FA (PRENATAL MULTIVITAMIN) TABS tablet Take 1 tablet by mouth daily at 12 noon.   Yes [provider]  calcium carbonate (TUMS - DOSED IN MG ELEMENTAL CALCIUM) 500 MG chewable tablet Chew 1 tablet by mouth as needed for indigestion or heartburn. Patient not taking: Reported on 07/27/2022    [provider]  ferrous sulfate 325 (65 FE) MG tablet Take 1 tablet (325 mg total) by mouth 2 (two) times daily with a meal. Patient not taking: Reported on 01/02/2019 11/18/18   Arvilla Market, MD  ibuprofen (ADVIL) 800 MG tablet Take 1 tablet (800 mg total) by mouth every 8 (eight) hours. Patient not taking: Reported on 01/02/2019 11/18/18   Arvilla Market, MD  OVER THE COUNTER MEDICATION Take 1 tablet by mouth daily. Blood Builder Patient not taking:  Reported on 07/27/2022    [provider]  polyethylene glycol (MIRALAX / GLYCOLAX) 17 g packet Take 17 g by mouth daily. Patient not taking: Reported on 01/02/2019 11/18/18   Arvilla Market, MD  senna-docusate (SENOKOT-S) 8.6-50 MG tablet Take 2 tablets by mouth daily. Patient not taking: Reported on 01/02/2019 11/19/18   Arvilla Market, MD    Physical Exam Vitals: Blood pressure 118/75, pulse 100, weight 233 lb 9.6 oz (106 kg), last menstrual period 01/14/2022, unknown if currently breastfeeding.  General: NAD HEENT: normocephalic, anicteric Thyroid: no enlargement, no palpable nodules Pulmonary: No increased work of breathing, CTAB Cardiovascular: RRR, distal pulses 2+ Abdomen: NABS, soft, non-tender, non-distended.  Umbilicus without lesions.  No hepatomegaly, splenomegaly or masses palpable. No evidence of hernia  Genitourinary:  External: Normal external female genitalia.  Normal urethral meatus, normal  Bartholin's and Skene's glands.    Vagina: exam deferred   Cervix:   Uterus:   Adnexa:   Rectal: deferred Extremities: no edema, erythema, or tenderness Neurologic: Grossly intact Psychiatric: mood appropriate, affect full   Assessment: 37 y.o. G3P2002 at [redacted]w[redacted]d presenting to initiate prenatal care  Plan: 1) Avoid alcoholic beverages. 2) Patient encouraged not to smoke.  3) Discontinue the use of all non-medicinal drugs and chemicals.  4) Take prenatal vitamins daily.  5) Nutrition, food safety (fish, cheese advisories, and high nitrite foods) and exercise discussed. 6) Hospital and practice style discussed with cross coverage system.  7) Genetic Screening, such as with 1st Trimester Screening, cell free fetal DNA, AFP testing, and Ultrasound, as well as with amniocentesis and CVS as appropriate, is discussed with patient. At the conclusion of today's visit patient declined genetic testing 8) Patient is asked about travel to areas at risk for  the [place] virus, and counseled to avoid travel and exposure to mosquitoes or sexual partners who may have themselves been exposed to the virus. Testing is discussed, and will be ordered as appropriate.   9) High BMI, reviewed POC including growth scans every 4 weeks, NST at 36 wks, and IOL by 40 weeks.  -Cheryel would prefer to have a growth scan at 36wks only, seems  resistant to IOL unless medically indicated. Reviewed labor prior to 41wks may be in her best interest for a VBAC. She may consider a membrane sweep at 39 and or 40wks.  She did have an early one hour screen, she would prefer to check her blood glucose 4 times a day x 1 week than repeat glucose testing. She is willing to do the glucose test if we as practice require it. She was not started on baby ASA, it is now too late to start. Discussed weight gain between 10-20lbs, continuing to be physically active, nutrition consult as needed.    10) elevated blood pressure: Pt will return in 1 week for BP check, reviewed GHTN versus preeclampsia, understands labs will be collected if BP elevated at next visit. Aware that pt's with GHTN are recommended to induce prior to 40wks.   11) C/s x2, desires VBAC; will see MD next visit   Carie Caddy, CNM  [facility] 07/28/2022, 4:38 PM

## 2022-08-01 DIAGNOSIS — O09899 Supervision of other high risk pregnancies, unspecified trimester: Secondary | ICD-10-CM | POA: Insufficient documentation

## 2022-08-03 ENCOUNTER — Ambulatory Visit (INDEPENDENT_AMBULATORY_CARE_PROVIDER_SITE_OTHER): Payer: Self-pay

## 2022-08-03 VITALS — BP 114/71 | HR 96 | Ht 61.0 in | Wt 233.0 lb

## 2022-08-03 DIAGNOSIS — Z0130 Encounter for examination of blood pressure without abnormal findings: Secondary | ICD-10-CM

## 2022-08-03 NOTE — Progress Notes (Cosign Needed Addendum)
    NURSE VISIT NOTE  Subjective:    Patient ID: Amanda Foley, female    DOB: November 25, 1985, 37 y.o.   MRN: [id number]  HPI  Patient is a 37 y.o. G87P2002 female who presents for BP check per order from Carie Caddy, [place].   Patient reports compliance with prescribed BP medications: Patient is NOT on blood pressure medication. She was asked to come for a blood pressure check because she had a high blood pressure reading (first reading) at her NOB visit. Her second reading was in normal range.   BP Readings from Last 3 Encounters:  08/03/22 114/71  07/27/22 118/75  01/02/19 131/83   Pulse Readings from Last 3 Encounters:  08/03/22 96  07/27/22 100  01/02/19 89    Objective:    BP 114/71   Pulse 96   Ht 5\' 1"  (1.549 m)   Wt 233 lb (105.7 kg)   LMP 01/14/2022   Breastfeeding No   BMI 44.02 kg/m   Assessment:   1. Blood pressure check      Plan:   Per Dr. Carie Caddy, CNM:  Return to clinic PRN  Patient verbalized understanding of instructions.   Donnetta Hail, CMA

## 2022-08-03 NOTE — Patient Instructions (Signed)
Hypertension During Pregnancy Hypertension is also called high blood pressure. High blood pressure means that the force of the blood moving in your body is high enough to cause problems for you and your baby. Different types of high blood pressure can happen during pregnancy. The types are: High blood pressure before you got pregnant. This is called chronic hypertension.  This can continue during your pregnancy. Your doctor will want to keep checking your blood pressure. You may need medicine to control your blood pressure while you are pregnant. You will need follow-up visits after you have your baby. High blood pressure that goes up during pregnancy when it was normal before. This is called gestational hypertension. It will often get better after you have your baby, but your doctor will need to watch your blood pressure to make sure that it is getting better. You may develop high blood pressure after giving birth. This is called postpartum hypertension. This often occurs within 48 hours after childbirth but may occur up to 6 weeks after giving birth. Very high blood pressure during pregnancy is an emergency that needs treatment right away. How does this affect me? If you have high blood pressure during pregnancy, you have a higher chance of developing high blood pressure: As you get older. If you get pregnant again. In some cases, high blood pressure during pregnancy can cause: Stroke. Heart attack. Damage to the kidneys, lungs, or liver. Preeclampsia. HELLP syndrome. Seizures. Problems with the placenta. How does this affect my baby? Your baby may: Be born early. Not weigh as much as he or she should. Not handle labor well, leading to a C-section. This condition may also result in a baby's death before birth (stillbirth). What are the risks? Having high blood pressure during a past pregnancy. Being overweight. Being age 35 or older. Being pregnant for the first time. Being pregnant  with more than one baby. Becoming pregnant using fertility methods, such as IVF. Having other problems, such as diabetes or kidney disease. What can I do to lower my risk?  Keep a healthy weight. Eat a healthy diet. Follow what your doctor tells you about treating any medical problems that you had before you got pregnant. It is very important to go to all of your doctor visits. Your doctor will check your blood pressure and make sure that your pregnancy is progressing as it should. Treatment should start early if a problem is found. How is this treated? Treatment for high blood pressure during pregnancy can vary. It depends on the type of high blood pressure you have and how serious it is. If you were taking medicine for your blood pressure before you got pregnant, talk with your doctor. You may need to change the medicine during pregnancy if it is not safe for your baby. If your blood pressure goes up during pregnancy, your doctor may order medicine to treat this. If you are at risk for preeclampsia, your doctor may tell you to take a low-dose aspirin while you are pregnant. If you have very high blood pressure, you may need to stay in the hospital so you and your baby can be watched closely. You may also need to take medicine to lower your blood pressure. In some cases, if your condition gets worse, you may need to have your baby early. Follow these instructions at home: Eating and drinking  Drink enough fluid to keep your pee (urine) pale yellow. Avoid caffeine. Lifestyle Do not smoke or use any products that contain   nicotine or tobacco. If you need help quitting, ask your doctor. Do not use alcohol or drugs. Avoid stress. Rest and get plenty of sleep. Regular exercise can help. Ask your doctor what kinds of exercise are best for you. General instructions Take over-the-counter and prescription medicines only as told by your doctor. Keep all prenatal and follow-up visits. Contact a  doctor if: You have symptoms that your doctor told you to watch for, such as: Headaches. A feeling like you may vomit (nausea). Vomiting. Belly (abdominal) pain. Feeling dizzy or light-headed. Get help right away if: You have symptoms of serious problems, such as: Very bad belly pain that does not get better with treatment. A very bad headache that does not get better. Blurry vision. Double vision. Vomiting that does not get better. Sudden, fast weight gain. Sudden swelling in your hands, ankles, or face. Bleeding from your vagina. Blood in your pee. Shortness of breath. Chest pain. Weakness on one side of your body. Trouble talking. Your baby is not moving as much as usual. These symptoms may be an emergency. Get help right away. Call your local emergency services (911 in the [place] Do not wait to see if the symptoms will go away. Do not drive yourself to the hospital. Summary High blood pressure is also called hypertension. High blood pressure means that the force of the blood moving in your body is high enough to cause problems for you and your baby. Get help right away if you have symptoms of serious problems due to high blood pressure. Keep all prenatal and follow-up visits. This information is not intended to replace advice given to you by your health care provider. Make sure you discuss any questions you have with your health care provider. Document Revised: 10/17/2019 Document Reviewed: 10/17/2019 Elsevier Patient Education  2024 Elsevier Inc.  

## 2022-08-04 ENCOUNTER — Encounter: Payer: Self-pay | Admitting: Licensed Practical Nurse

## 2022-08-06 DIAGNOSIS — Z2839 Other underimmunization status: Secondary | ICD-10-CM | POA: Insufficient documentation

## 2022-08-09 ENCOUNTER — Ambulatory Visit (INDEPENDENT_AMBULATORY_CARE_PROVIDER_SITE_OTHER): Payer: Self-pay

## 2022-08-09 VITALS — BP 128/85 | HR 94 | Wt 236.0 lb

## 2022-08-09 DIAGNOSIS — O09899 Supervision of other high risk pregnancies, unspecified trimester: Secondary | ICD-10-CM

## 2022-08-09 DIAGNOSIS — O99213 Obesity complicating pregnancy, third trimester: Secondary | ICD-10-CM

## 2022-08-09 DIAGNOSIS — Z2839 Other underimmunization status: Secondary | ICD-10-CM

## 2022-08-09 DIAGNOSIS — Z3A29 29 weeks gestation of pregnancy: Secondary | ICD-10-CM

## 2022-08-09 NOTE — Assessment & Plan Note (Addendum)
Reviewed blood sugar log from last week. 5/7 fasting blood sugars 95 or greater (range 93-100), and no elevated postprandial (range 87-118). She has tried various things to improve fasting blood sugars including trying different types of snacks at night vs no snack and has not noticed any appreciable difference. She feels the elevated values may be related to her not getting good sleep overnight. Will plan to continue to check blood sugars for another week. Reviewed kick counts and preterm labor warning signs. Instructed to call office or come to hospital with persistent headache, vision changes, regular contractions, leaking of fluid, decreased fetal movement or vaginal bleeding.

## 2022-08-09 NOTE — Assessment & Plan Note (Addendum)
BMI 44. Discussed recommendation for growth ultrasound. She is undecided at this time on scheduling prior to 36 weeks. Will place order and she will call back to schedule if she decides to do an earlier growth scan. Also reviewed need for anesthesia consult with BMI >45. Will plan to schedule if needed.

## 2022-08-09 NOTE — Assessment & Plan Note (Addendum)
Reviewed need to schedule next appointment with MD for VBAC consent.

## 2022-08-09 NOTE — Progress Notes (Signed)
    Return Prenatal Note   Assessment/Plan   Plan  37 y.o. G3P2002 at [redacted]w[redacted]d presents for follow-up OB visit. Reviewed prenatal record including previous visit note.  Supervision of other high risk pregnancy, antepartum Reviewed blood sugar log from last week. 5/7 fasting blood sugars 95 or greater (range 93-100), and no elevated postprandial (range 87-118). She has tried various things to improve fasting blood sugars including trying different types of snacks at night vs no snack and has not noticed any appreciable difference. She feels the elevated values may be related to her not getting good sleep overnight. Will plan to continue to check blood sugars for another week. Reviewed kick counts and preterm labor warning signs. Instructed to call office or come to hospital with persistent headache, vision changes, regular contractions, leaking of fluid, decreased fetal movement or vaginal bleeding.   Morbid obesity with body mass index (BMI) of 40.0 or higher (HCC) BMI 44. Discussed recommendation for growth ultrasound. She is undecided at this time on scheduling prior to 36 weeks. Will place order and she will call back to schedule if she decides to do an earlier growth scan. Also reviewed need for anesthesia consult with BMI >45. Will plan to schedule if needed.  Cesarean delivery delivered Reviewed need to schedule next appointment with MD for VBAC consent.    No orders of the defined types were placed in this encounter.  Return in about 1 week (around 08/16/2022) for ROB with MD for VBAC consent.   Future Appointments  Date Time Provider Department Center  08/25/2022  1:55 PM Hildred Laser, MD [facility] None  09/07/2022  3:35 PM Hildred Laser, MD [facility] None    For next visit:   meet with MD for VBAC consent, review of blood sugars     Subjective   36 y.o. [id number] at [redacted]w[redacted]d presents for this follow-up prenatal visit.  Patient has no concerns.  Patient reports: Movement:  Present Contractions: Not present  Objective   Flow sheet Vitals: Pulse Rate: 94 BP: 128/85 Fundal Height: 30 cm Fetal Heart Rate (bpm): 140 Total weight gain: 15 lb (6.804 kg)  General Appearance  No acute distress, well appearing, and well nourished Pulmonary   Normal work of breathing Neurologic   Alert and oriented to person, place, and time Psychiatric   Mood and affect within normal limits  Amanda Foley, CNM  07/02/244:12 PM

## 2022-08-24 ENCOUNTER — Encounter: Payer: Self-pay | Admitting: Certified Nurse Midwife

## 2022-08-25 ENCOUNTER — Encounter: Payer: Self-pay | Admitting: Obstetrics and Gynecology

## 2022-08-25 ENCOUNTER — Ambulatory Visit (INDEPENDENT_AMBULATORY_CARE_PROVIDER_SITE_OTHER): Payer: Self-pay | Admitting: Obstetrics and Gynecology

## 2022-08-25 VITALS — BP 114/77 | HR 103 | Wt 237.4 lb

## 2022-08-25 DIAGNOSIS — Z98891 History of uterine scar from previous surgery: Secondary | ICD-10-CM

## 2022-08-25 DIAGNOSIS — O99210 Obesity complicating pregnancy, unspecified trimester: Secondary | ICD-10-CM

## 2022-08-25 DIAGNOSIS — O99213 Obesity complicating pregnancy, third trimester: Secondary | ICD-10-CM

## 2022-08-25 DIAGNOSIS — O34219 Maternal care for unspecified type scar from previous cesarean delivery: Secondary | ICD-10-CM

## 2022-08-25 DIAGNOSIS — O09899 Supervision of other high risk pregnancies, unspecified trimester: Secondary | ICD-10-CM

## 2022-08-25 DIAGNOSIS — Z3A31 31 weeks gestation of pregnancy: Secondary | ICD-10-CM

## 2022-08-25 DIAGNOSIS — E669 Obesity, unspecified: Secondary | ICD-10-CM

## 2022-08-25 NOTE — Progress Notes (Addendum)
ROB: Patient is a 37 y.o. G3P2002 at [redacted]w[redacted]d who presents for routine OB care.  Pregnancy is complicated by has History of 2 cesarean sections; Postoperative ileus (HCC); Supervision of other high risk pregnancy, antepartum; Morbid obesity with body mass index (BMI) of 40.0 or higher (HCC); Maternal varicella, non-immune; and desiring vaginal birth after cesarean section (VBAC).   Patient has no major complaints today. For TOLAC counseling today. Patient with h/o 2 prior C-sections, first for arrest of dilation (6 cm) at 41 weeks, 7#15oz, second for arrest of descent (complete dilation, pushed for 4 hours) at 42 weeks, 8#14oz.   The following risks were discussed with the patient.  Risk of uterine rupture at term is 0.78 percent with TOLAC and 0.22 percent with ERCD. 1 in 10 uterine ruptures will result in neonatal death or neurological injury. Increases up to 2 percent after second C-section.  The benefits of a trial of labor after cesarean (TOLAC) resulting in a vaginal birth after cesarean (VBAC) include the following: shorter length of hospital stay and postpartum recovery (in most cases); fewer complications, such as postpartum fever, wound or uterine infection, thromboembolism (blood clots in the leg or lung), need for blood transfusion and fewer neonatal breathing problems. The risks of an attempted VBAC or TOLAC include the following: Risk of failed trial of labor after cesarean (TOLAC) without a vaginal birth after cesarean (VBAC) resulting in repeat cesarean delivery (RCD) in about 20 to 40 percent of women who attempt VBAC.  Risk of rupture of uterus resulting in an emergency cesarean delivery. The risk of uterine rupture may be related in part to the type of uterine incision made during the first cesarean delivery. A previous transverse uterine incision has the lowest risk of rupture (0.2 to 1.5 percent risk). Vertical or T-shaped uterine incisions have a higher risk of uterine rupture (4 to 9  percent risk)The risk of fetal death is very low with both VBAC and elective repeat cesarean delivery (ERCD), but the likelihood of fetal death is higher with VBAC than with ERCD. Maternal death is very rare with either type of delivery. The risks of an elective repeat cesarean delivery (ERCD) were reviewed with the patient including but not limited to: 03/998 risk of uterine rupture which could have serious consequences, bleeding which may require transfusion; infection which may require antibiotics; injury to bowel, bladder or other surrounding organs (bowel, bladder, ureters); injury to the fetus; need for additional procedures including hysterectomy in the event of a life-threatening hemorrhage; thromboembolic phenomenon; abnormal placentation; incisional problems; death and other postoperative or anesthesia complications.    VBAC Calculated score is 21.7% (95% confidence interval: 18.5%, 25.3%)   These risks and benefits are summarized on the consent form, which was reviewed with the patient during the visit. Based on calculated score, not a good candidate for successful TOLAC.  All her questions answered and she signed a consent still indicating a preference for TOLAC.  The consent will be scanned to her chart for her review. Also discussed risk factors that may impact her success (postdates, fetal weight, maternal obesity). Also discussed positional maneuvers that may be helpful antepartum or during labor.    Discussed growth scans due to BMI, declines at this time, but will consider later in the pregnancy (36-38 weeks).  Will need weekly antenatal testing beginning at 36 weeks. Will need consultation with Anesthesia if BMI >45.  Discussed contraception, will use NFP, condoms. Will use [facility] for pediatrician. Will continue routine postpartum care.  Hildred Laser, MD [facility]

## 2022-08-25 NOTE — Progress Notes (Signed)
ROB [redacted]w[redacted]d: She is doing well. She reports good fetal movement. She has no new concerns and she was unable to leave a urine sample today.

## 2022-09-07 ENCOUNTER — Encounter: Payer: Self-pay | Admitting: Obstetrics and Gynecology

## 2022-09-07 ENCOUNTER — Encounter: Payer: Self-pay | Admitting: Certified Nurse Midwife

## 2022-09-08 ENCOUNTER — Ambulatory Visit (INDEPENDENT_AMBULATORY_CARE_PROVIDER_SITE_OTHER): Payer: Self-pay | Admitting: Obstetrics

## 2022-09-08 ENCOUNTER — Encounter: Payer: Self-pay | Admitting: Obstetrics

## 2022-09-08 VITALS — BP 105/71 | HR 109 | Wt 238.0 lb

## 2022-09-08 DIAGNOSIS — O09899 Supervision of other high risk pregnancies, unspecified trimester: Secondary | ICD-10-CM

## 2022-09-08 DIAGNOSIS — Z3A33 33 weeks gestation of pregnancy: Secondary | ICD-10-CM

## 2022-09-08 NOTE — Progress Notes (Signed)
    Return Prenatal Note   Assessment/Plan   Plan  37 y.o. G3P2002 at [redacted]w[redacted]d presents for follow-up OB visit. Reviewed prenatal record including previous visit note.  Morbid obesity with body mass index (BMI) of 40.0 or higher (HCC) -Weekly NSTs at 36 weeks -Declines growth scans -Declines IOL before 41 weeks  Supervision of other high risk pregnancy, antepartum -Discussed GBS and GC/chlamydia swabs at next visit -Blood sugars have been WNL -Baby feels cephalic by Leopold's today    Orders Placed This Encounter  Procedures   POC Urinalysis Dipstick OB   Return in about 2 weeks (around 09/22/2022).   Future Appointments  Date Time Provider Department Center  09/21/2022  3:00 PM AOB-NST ROOM [facility] None  09/21/2022  3:35 PM Tresea Mall, CNM [facility] None  09/28/2022  3:00 PM AOB-NST ROOM [facility] None  09/28/2022  3:35 PM Mirna Mires, CNM [facility] None  10/05/2022  3:00 PM AOB-NST ROOM [facility] None  10/05/2022  3:35 PM Guadlupe Spanish M, CNM [facility] None  10/12/2022  3:00 PM AOB-NST ROOM [facility] None  10/12/2022  3:35 PM Mirna Mires, CNM [facility] None    For next visit:  ROB with GBS screening & NST     Subjective   37 y.o. [id number] at [redacted]w[redacted]d presents for this follow-up prenatal visit.  She is feeling good and has continued tracking her blood sugars. Her baby felt transverse to her but she thinks the baby is back in vertex position now - has been doing Magazine features editor exercise. She has been reading about growth scans and declines all growth scans this pregnancy. She is willing to do NSTs. She does not want to go to 42 weeks this pregnancy, but does not want to be induced before 41 weeks.  Movement: Present Contractions: Not present  Objective   Flow sheet Vitals: Pulse Rate: (!) 109 BP: 105/71 Fundal Height: 35 cm Fetal Heart Rate (bpm): 153 Total weight gain: 17 lb (7.711 kg)  General Appearance  No acute distress, well appearing, and well  nourished Pulmonary   Normal work of breathing Neurologic   Alert and oriented to person, place, and time Psychiatric   Mood and affect within normal limits  Guadlupe Spanish, CNM 09/08/22 9:33 AM

## 2022-09-08 NOTE — Assessment & Plan Note (Addendum)
-  Discussed GBS and GC/chlamydia swabs at next visit -Blood sugars have been WNL -Baby feels cephalic by Leopold's today

## 2022-09-08 NOTE — Assessment & Plan Note (Signed)
-  Weekly NSTs at 36 weeks -Declines growth scans -Declines IOL before 41 weeks

## 2022-09-21 ENCOUNTER — Encounter: Payer: Self-pay | Admitting: Advanced Practice Midwife

## 2022-09-21 ENCOUNTER — Other Ambulatory Visit: Payer: Self-pay

## 2022-09-28 ENCOUNTER — Other Ambulatory Visit: Payer: Self-pay

## 2022-09-28 ENCOUNTER — Encounter: Payer: Self-pay | Admitting: Obstetrics

## 2022-10-05 ENCOUNTER — Encounter: Payer: Self-pay | Admitting: Obstetrics

## 2022-10-05 ENCOUNTER — Other Ambulatory Visit: Payer: Self-pay

## 2022-10-12 ENCOUNTER — Other Ambulatory Visit: Payer: Self-pay

## 2022-10-12 ENCOUNTER — Encounter: Payer: Self-pay | Admitting: Obstetrics

## 2022-11-02 ENCOUNTER — Other Ambulatory Visit: Payer: Self-pay

## 2022-11-02 ENCOUNTER — Inpatient Hospital Stay (HOSPITAL_COMMUNITY)
Admission: AD | Admit: 2022-11-02 | Discharge: 2022-11-02 | Discharge status: Against Medical Advice | Payer: Self-pay | Attending: Obstetrics and Gynecology | Admitting: Obstetrics and Gynecology

## 2022-11-02 ENCOUNTER — Encounter (HOSPITAL_COMMUNITY): Payer: Self-pay | Admitting: Obstetrics and Gynecology

## 2022-11-02 DIAGNOSIS — O34219 Maternal care for unspecified type scar from previous cesarean delivery: Secondary | ICD-10-CM | POA: Insufficient documentation

## 2022-11-02 DIAGNOSIS — R030 Elevated blood-pressure reading, without diagnosis of hypertension: Secondary | ICD-10-CM | POA: Insufficient documentation

## 2022-11-02 DIAGNOSIS — O26893 Other specified pregnancy related conditions, third trimester: Secondary | ICD-10-CM | POA: Insufficient documentation

## 2022-11-02 DIAGNOSIS — O09899 Supervision of other high risk pregnancies, unspecified trimester: Secondary | ICD-10-CM

## 2022-11-02 DIAGNOSIS — O480 Post-term pregnancy: Secondary | ICD-10-CM | POA: Insufficient documentation

## 2022-11-02 DIAGNOSIS — Z3A41 41 weeks gestation of pregnancy: Secondary | ICD-10-CM | POA: Insufficient documentation

## 2022-11-02 DIAGNOSIS — O0933 Supervision of pregnancy with insufficient antenatal care, third trimester: Secondary | ICD-10-CM | POA: Insufficient documentation

## 2022-11-02 DIAGNOSIS — Z2839 Other underimmunization status: Secondary | ICD-10-CM

## 2022-11-02 LAB — CBC
HCT: 37.9 % (ref 36.0–46.0)
Hemoglobin: 12.1 g/dL (ref 12.0–15.0)
MCH: 26.3 pg (ref 26.0–34.0)
MCHC: 31.9 g/dL (ref 30.0–36.0)
MCV: 82.4 fL (ref 80.0–100.0)
Platelets: 257 10*3/uL (ref 150–400)
RBC: 4.6 MIL/uL (ref 3.87–5.11)
RDW: 14.6 % (ref 11.5–15.5)
WBC: 8.9 10*3/uL (ref 4.0–10.5)
nRBC: 0 % (ref 0.0–0.2)

## 2022-11-02 LAB — URINALYSIS, ROUTINE W REFLEX MICROSCOPIC
Bilirubin Urine: NEGATIVE
Glucose, UA: NEGATIVE mg/dL
Hgb urine dipstick: NEGATIVE
Ketones, ur: NEGATIVE mg/dL
Nitrite: NEGATIVE
Protein, ur: NEGATIVE mg/dL
Specific Gravity, Urine: 1.008 (ref 1.005–1.030)
pH: 7 (ref 5.0–8.0)

## 2022-11-02 LAB — COMPREHENSIVE METABOLIC PANEL
ALT: 17 U/L (ref 0–44)
AST: 18 U/L (ref 15–41)
Albumin: 2.5 g/dL — ABNORMAL LOW (ref 3.5–5.0)
Alkaline Phosphatase: 124 U/L (ref 38–126)
Anion gap: 10 (ref 5–15)
BUN: 13 mg/dL (ref 6–20)
CO2: 20 mmol/L — ABNORMAL LOW (ref 22–32)
Calcium: 8.8 mg/dL — ABNORMAL LOW (ref 8.9–10.3)
Chloride: 104 mmol/L (ref 98–111)
Creatinine, Ser: 0.7 mg/dL (ref 0.44–1.00)
GFR, Estimated: >60 mL/min (ref >60)
Glucose, Bld: 97 mg/dL (ref 70–99)
Potassium: 4.1 mmol/L (ref 3.5–5.1)
Sodium: 134 mmol/L — ABNORMAL LOW (ref 135–145)
Total Bilirubin: 0.2 mg/dL — ABNORMAL LOW (ref 0.3–1.2)
Total Protein: 5.9 g/dL — ABNORMAL LOW (ref 6.5–8.1)

## 2022-11-02 LAB — HEPATITIS B SURFACE ANTIGEN: Hepatitis B Surface Ag: NONREACTIVE

## 2022-11-02 LAB — HIV ANTIBODY (ROUTINE TESTING W REFLEX): HIV Screen 4th Generation wRfx: NONREACTIVE

## 2022-11-02 LAB — TYPE AND SCREEN
ABO/RH(D): O POS
Antibody Screen: NEGATIVE

## 2022-11-02 NOTE — MAU Provider Note (Signed)
History     [id number]  Arrival date and time: 11/02/22 1322    Chief Complaint  Patient presents with   Induction of labor     Patient states that it is time she has a baby, she thought she would be in labor by now.      HPI Amanda Foley is a 37 y.o. at [redacted]w[redacted]d by outside [place] with PMHx notable for cesarean x2, who presents to check on fetal status and request IOL.   Patient has complex relationship with healthcare system Extensive chart review done of patient's history  First pregnancy went to [facility], transferred for epidural and had primary cesarean for arrest of dilation with Dr. Billy Coast in 2018, infant weighed 3605g Had CPM care for second pregnancy, attempted home TOLAC but eventually transferred to [facility] after SROM with meconium stained fluid. Ultimately received epidural, got to 10 cm and +1 station. She then pushed for four hours before undergoing repeat cesarean. Infant weight 4040g. During this pregnancy initially went to [facility] but then transferred to [facility] at 28 weeks Had four visits with them, last on 09/08/2022 at [redacted]w[redacted]d Declined GDM testing, per notes checked home sugars and they were wnl She declined anatomy scan until 36 weeks which was ultimately not done She desired and plan was for [facility], she had also expressed desire not to be induced until 41 weeks though earlier IOL was recommended to her  Today reports she wants to check on status of baby and is requesting IOL She denies any leakage of fluid or vaginal bleeding Fetal movement is normal  --/--/O POS (09/25 1648)  OB History     Gravida  3   Para  2   Term  2   Preterm      AB      Living  2      SAB      IAB      Ectopic      Multiple  0   Live Births  2           Past Medical History:  Diagnosis Date   No pertinent past medical history     Past Surgical History:  Procedure Laterality Date   CESAREAN SECTION N/A 08/16/2016   Procedure: CESAREAN SECTION;   Surgeon: Olivia Mackie, MD;  Location: [facility];  Service: Obstetrics;  Laterality: N/A;   CESAREAN SECTION     CESAREAN SECTION N/A 11/15/2018   Procedure: CESAREAN SECTION;  Surgeon: Lazaro Arms, MD;  Location: [facility] LD ORS;  Service: Obstetrics;  Laterality: N/A;   WISDOM TOOTH EXTRACTION      Family History  Problem Relation Age of Onset   Stroke Mother    Stroke Father    Heart disease Maternal Grandmother    Colon cancer Maternal Grandmother     Social History   Socioeconomic History   Marital status: Married    Spouse name: Not on file   Number of children: Not on file   Years of education: Not on file   Highest education level: Not on file  Occupational History   Not on file  Tobacco Use   Smoking status: Never   Smokeless tobacco: Never  Vaping Use   Vaping status: Never Used  Substance and Sexual Activity   Alcohol use: No   Drug use: No   Sexual activity: Yes    Birth control/protection: None  Other Topics Concern   Not on file  Social History Narrative  Not on file   Social Determinants of Health   Financial Resource Strain: Not on file  Food Insecurity: Not on file  Transportation Needs: No Transportation Needs (03/07/2018)   PRAPARE - Administrator, Civil Service (Medical): No    Lack of Transportation (Non-Medical): No  Physical Activity: Not on file  Stress: Not on file  Social Connections: Unknown (03/07/2018)   Social Connection and Isolation Panel [NHANES]    Frequency of Communication with Friends and Family: Not on file    Frequency of Social Gatherings with Friends and Family: Not on file    Attends Religious Services: Not on file    Active Member of Clubs or Organizations: Not on file    Attends Banker Meetings: Not on file    Marital Status: Married  Intimate Partner Violence: Not At Risk (03/07/2018)   Humiliation, Afraid, Rape, and Kick questionnaire    Fear of Current or Ex-Partner: No     Emotionally Abused: No    Physically Abused: No    Sexually Abused: No    Allergies  Allergen Reactions   Almond (Diagnostic)    Gluten Meal Other (See Comments)    Reaction: G. I.   Lactose Intolerance (Gi) Other (See Comments)    Reaction: G.I.    No current facility-administered medications on file prior to encounter.   Current Outpatient Medications on File Prior to Encounter  Medication Sig Dispense Refill   Accu-Chek Softclix Lancets lancets 1 each by Other route 4 (four) times daily. Check fasting, and then 1-2 hours after every meal 100 each 12   Blood Glucose Monitoring Suppl (ACCU-CHEK GUIDE) w/Device KIT 1 kit by Does not apply route in the morning, at noon, in the evening, and at bedtime. Check blood sugars for fasting, and two hours after breakfast, lunch and dinner (4 checks daily) 1 kit 0   glucose blood (ACCU-CHEK GUIDE) test strip 1 each by Other route in the morning, at noon, in the evening, and at bedtime. Check blood glucose four times a day: fasting and 2 hours after breakfast, lunch, and dinner 100 each 6   Prenatal Vit-Fe Fumarate-FA (PRENATAL MULTIVITAMIN) TABS tablet Take 1 tablet by mouth daily at 12 noon.       ROS Pertinent positives and negative per HPI, all others reviewed and negative  Physical Exam   BP (!) 140/81 (BP Location: Right Arm)   Pulse 95   Temp 98.4 F (36.9 C) (Oral)   Resp 20   Ht 5\' 1"  (1.549 m)   Wt 113.7 kg   LMP 01/14/2022   SpO2 98%   BMI 47.37 kg/m   Patient Vitals for the past 24 hrs:  BP Temp Temp src Pulse Resp SpO2 Height Weight  11/02/22 1545 -- -- -- -- -- 98 % -- --  11/02/22 1441 (!) 140/81 98.4 F (36.9 C) Oral 95 20 97 % -- --  11/02/22 1414 (!) 139/92 98.3 F (36.8 C) Oral 100 18 98 % -- --  11/02/22 1408 -- -- -- -- -- -- 5\' 1"  (1.549 m) 113.7 kg    Physical Exam Vitals reviewed.  Constitutional:      General: She is not in acute distress.    Appearance: She is well-developed. She is not  diaphoretic.  Eyes:     General: No scleral icterus. Pulmonary:     Effort: Pulmonary effort is normal. No respiratory distress.  Abdominal:     General: There is no distension.  Palpations: Abdomen is soft.     Tenderness: There is no abdominal tenderness. There is no guarding or rebound.  Skin:    General: Skin is warm and dry.  Neurological:     Mental Status: She is alert.     Coordination: Coordination normal.      Cervical Exam    Bedside Ultrasound Pt informed that the ultrasound is considered a limited OB ultrasound and is not intended to be a complete ultrasound exam.  Patient also informed that the ultrasound is not being completed with the intent of assessing for fetal or placental anomalies or any pelvic abnormalities.  Explained that the purpose of today's ultrasound is to assess for  presentation.  Patient acknowledges the purpose of the exam and the limitations of the study.      My interpretation: vertex presentation  FHT Baseline: 150 bpm Variability: Good {> 6 bpm) Accelerations: Reactive Decelerations: Absent Uterine activity: poor tracing, a few rare contractions for sure Cat: I  Labs Results for orders placed or performed during the hospital encounter of 11/02/22 (from the past 24 hour(s))  Urinalysis, Routine w reflex microscopic -Urine, Clean Catch     Status: Abnormal   Collection Time: 11/02/22  2:18 PM  Result Value Ref Range   Color, Urine YELLOW YELLOW   APPearance HAZY (A) CLEAR   Specific Gravity, Urine 1.008 1.005 - 1.030   pH 7.0 5.0 - 8.0   Glucose, UA NEGATIVE NEGATIVE mg/dL   Hgb urine dipstick NEGATIVE NEGATIVE   Bilirubin Urine NEGATIVE NEGATIVE   Ketones, ur NEGATIVE NEGATIVE mg/dL   Protein, ur NEGATIVE NEGATIVE mg/dL   Nitrite NEGATIVE NEGATIVE   Leukocytes,Ua LARGE (A) NEGATIVE   RBC / HPF 0-5 0 - 5 RBC/hpf   WBC, UA 11-20 0 - 5 WBC/hpf   Bacteria, UA FEW (A) NONE SEEN   Squamous Epithelial / HPF 11-20 0 - 5 /HPF  Type  and screen [facility]     Status: None   Collection Time: 11/02/22  4:48 PM  Result Value Ref Range   ABO/RH(D) O POS    Antibody Screen NEG    Sample Expiration      11/05/2022,2359 Performed at [facility], [address], [place], [place] [address]   HIV Antibody (routine testing w rflx)     Status: None   Collection Time: 11/02/22  4:53 PM  Result Value Ref Range   HIV Screen 4th Generation wRfx Non Reactive Non Reactive  Hepatitis B surface antigen     Status: None   Collection Time: 11/02/22  4:53 PM  Result Value Ref Range   Hepatitis B Surface Ag NON REACTIVE NON REACTIVE  Comprehensive metabolic panel     Status: Abnormal   Collection Time: 11/02/22  4:53 PM  Result Value Ref Range   Sodium 134 (L) 135 - 145 mmol/L   Potassium 4.1 3.5 - 5.1 mmol/L   Chloride 104 98 - 111 mmol/L   CO2 20 (L) 22 - 32 mmol/L   Glucose, Bld 97 70 - 99 mg/dL   BUN 13 6 - 20 mg/dL   Creatinine, Ser 2.13 0.44 - 1.00 mg/dL   Calcium 8.8 (L) 8.9 - 10.3 mg/dL   Total Protein 5.9 (L) 6.5 - 8.1 g/dL   Albumin 2.5 (L) 3.5 - 5.0 g/dL   AST 18 15 - 41 U/L   ALT 17 0 - 44 U/L   Alkaline Phosphatase 124 38 - 126 U/L  Total Bilirubin 0.2 (L) 0.3 - 1.2 mg/dL   GFR, Estimated >16 >10 mL/min   Anion gap 10 5 - 15  CBC     Status: None   Collection Time: 11/02/22  4:58 PM  Result Value Ref Range   WBC 8.9 4.0 - 10.5 K/uL   RBC 4.60 3.87 - 5.11 MIL/uL   Hemoglobin 12.1 12.0 - 15.0 g/dL   HCT 96.0 45.4 - 09.8 %   MCV 82.4 80.0 - 100.0 fL   MCH 26.3 26.0 - 34.0 pg   MCHC 31.9 30.0 - 36.0 g/dL   RDW 11.9 14.7 - 82.9 %   Platelets 257 150 - 400 K/uL   nRBC 0.0 0.0 - 0.2 %    Imaging No results found.  MAU Course  Procedures Lab Orders         Group B strep by PCR         Urinalysis, Routine w reflex microscopic -Urine, Clean Catch         HIV Antibody (routine testing w rflx)         RPR         Hepatitis B surface antigen         HCV Ab w Reflex to Quant PCR          Rubella screen         Comprehensive metabolic panel         Protein / creatinine ratio, urine         Hemoglobin A1c         CBC    No orders of the defined types were placed in this encounter.  Imaging Orders  No imaging studies ordered today    MDM Moderate (Level 3-4)  Assessment and Plan  # Post-dates pregnancy #History of cesarean delivery x2 #Scant prenatal care #[redacted] weeks gestation of pregnancy #Left AMA Patient requesting IOL. Recommended to patient that given her history, scant care, and late dates, that likely our group would not proceed with IOL until we have a growth [place] and some baseline labs to help adequately and appropriately counsel her on risks and benefits of a TOLAC. Initially resistant to this idea (did not elaborate why) but then consented to both. After about a half hour I was called back to the room and patient stated she did not want the [place] or labs to be done and that she would like to be discharged. I strongly recommended against this and advised that she stay for delivery given her elevated blood pressure and post dates pregnancy. I discussed risks of unsupervised TOLAC if she were to go into labor including uterine rupture with possibility of death for both her and fetus. I also discussed risk of developing pre-eclampsia and associated risks, and strongly recommended she at least get blood work. She consented to blood work which was drawn. I repeatedly emphasized that my recommendation would be to stay for delivery (regardless of mode) given all of the factors. She expressed full understanding of the risks of leaving against medical advice and left shortly after her blood was drawn. Prior to leaving we discussed she should continue to check her BP at home and return if elevated along with any symptoms of pre-eclampsia. Repeatedly emphasized she could return at any time to continue the evaluation so we could proceed toward delivery.   #Elevated blood pressure  without diagnosis of hypertension Patient with mild range BP's. Attributes these to white coat hypertension. Discussed that she is  at increased risk for developmental of PIH with AMA and post dates. Also reviewed that development of PreE is unpredictable and can be life threatening to both her and infant. I recommended for this reason she stay and be delivered. She rejected this recommendation but did accept collecting labs for Pre-eclampsia, and agreed to return if any severe features are revealed.   #FWB FHT Cat I NST: Reactive   Dispo: left AMA    Venora Maples, MD/MPH 11/02/22 8:12 PM

## 2022-11-02 NOTE — MAU Note (Signed)
Amanda Foley is a 37 y.o. at [redacted]w[redacted]d here in MAU reporting: she stopped receiving PVC @ 35 weeks secondary felt OB/GYN ([facility]) was pressuring to deliver via IOL @ 39 weeks secondary Hx previous Cesarean x2.   Pt states she now wants an IOL secondary she's almost 42 weeks.  Denies VB or LOF.  Endorses +FM. Requesting IOL or NST LMP: NA Onset of complaint: today Pain score: 0 Vitals:   11/02/22 1414  BP: (!) 139/92  Pulse: 100  Resp: 18  Temp: 98.3 F (36.8 C)  SpO2: 98%     FHT:133 bpm Lab orders placed from triage:   UA

## 2022-11-02 NOTE — Progress Notes (Signed)
Patient declining GBS culture at this time. Discussed with patient that if not treated and bacteria passes to infant that it can cause significant infection. Patient states that she will consider and let me know if she changes her mind.

## 2022-11-02 NOTE — Progress Notes (Signed)
Patient refusing to have IV started but reluctantly agrees to labs being drawn at this time.

## 2022-11-02 NOTE — Progress Notes (Signed)
FOB informed RN that they would like to leave. Dr. Crissie Reese notified of patient request and in to speak to patient. Patient agrees to have labs drawn prior to her discharge and lab called to come and draw labs.

## 2022-11-02 NOTE — MAU Note (Signed)
Patient presents to MAU stating that she feels it is time "to have a baby". Patient states that she has contracted over the past few days but that it did not progress to the point of labor per patient. Patient denies ROM, VB and endorses +FM. Patient states that she initially received [facility] with [place] until 20 weeks and then went to [facility] for [facility] until 35 weeks. She describers her first C/S for "malpresentation" (labored @ [facility]) but positional interventions did not work. The 2nd C/S she states that she labored to 10 cm and pushed for 4 hours and would not descend. Patient states that she prefers a trial of labor.

## 2022-11-02 NOTE — Progress Notes (Signed)
Dr Crissie Reese performed bedside ultrasound. Patient signing AMA form. Patient declining discharge vitals at this time. Patient instructed to return for care if ROM, VB, contractions, decreased FM. Patient verbalizes understanding of fetal kick counts. Patient leaving ambulatory.

## 2022-11-03 ENCOUNTER — Inpatient Hospital Stay
Admission: EM | Admit: 2022-11-03 | Discharge: 2022-11-05 | DRG: 787 | Disposition: A | Discharge status: Home / Self Care | Payer: Self-pay | Attending: Obstetrics and Gynecology | Admitting: Obstetrics and Gynecology

## 2022-11-03 ENCOUNTER — Other Ambulatory Visit: Payer: Self-pay

## 2022-11-03 ENCOUNTER — Inpatient Hospital Stay: Payer: Self-pay | Admitting: Anesthesiology

## 2022-11-03 ENCOUNTER — Encounter: Payer: Self-pay | Admitting: Obstetrics and Gynecology

## 2022-11-03 DIAGNOSIS — Z3A41 41 weeks gestation of pregnancy: Secondary | ICD-10-CM

## 2022-11-03 DIAGNOSIS — D62 Acute posthemorrhagic anemia: Secondary | ICD-10-CM | POA: Diagnosis not present

## 2022-11-03 DIAGNOSIS — O480 Post-term pregnancy: Principal | ICD-10-CM | POA: Diagnosis present

## 2022-11-03 DIAGNOSIS — O09899 Supervision of other high risk pregnancies, unspecified trimester: Principal | ICD-10-CM

## 2022-11-03 DIAGNOSIS — O34211 Maternal care for low transverse scar from previous cesarean delivery: Secondary | ICD-10-CM | POA: Diagnosis present

## 2022-11-03 DIAGNOSIS — O99214 Obesity complicating childbirth: Secondary | ICD-10-CM | POA: Diagnosis present

## 2022-11-03 DIAGNOSIS — O9081 Anemia of the puerperium: Secondary | ICD-10-CM | POA: Diagnosis not present

## 2022-11-03 LAB — HCV INTERPRETATION

## 2022-11-03 LAB — RPR: RPR Ser Ql: NONREACTIVE

## 2022-11-03 LAB — CBC
HCT: 38.9 % (ref 36.0–46.0)
Hemoglobin: 12.6 g/dL (ref 12.0–15.0)
MCH: 27 pg (ref 26.0–34.0)
MCHC: 32.4 g/dL (ref 30.0–36.0)
MCV: 83.5 fL (ref 80.0–100.0)
Platelets: 265 10*3/uL (ref 150–400)
RBC: 4.66 MIL/uL (ref 3.87–5.11)
RDW: 14.7 % (ref 11.5–15.5)
WBC: 16.7 10*3/uL — ABNORMAL HIGH (ref 4.0–10.5)
nRBC: 0 % (ref 0.0–0.2)

## 2022-11-03 LAB — HEMOGLOBIN A1C
Hgb A1c MFr Bld: 6.1 % — ABNORMAL HIGH (ref 4.8–5.6)
Mean Plasma Glucose: 128 mg/dL

## 2022-11-03 LAB — RUBELLA SCREEN: Rubella: 2.58 index (ref >0.99)

## 2022-11-03 LAB — TYPE AND SCREEN
ABO/RH(D): O POS
Antibody Screen: NEGATIVE

## 2022-11-03 LAB — HCV AB W REFLEX TO QUANT PCR: HCV Ab: NONREACTIVE

## 2022-11-03 MED ORDER — OXYCODONE-ACETAMINOPHEN 5-325 MG PO TABS: 1.0000 | ORAL_TABLET | ORAL | Status: DC | PRN

## 2022-11-03 MED ORDER — PHENYLEPHRINE 80 MCG/ML (10ML) SYRINGE FOR IV PUSH (FOR BLOOD PRESSURE SUPPORT): 80.0000 ug | PREFILLED_SYRINGE | INTRAVENOUS | Status: DC | PRN

## 2022-11-03 MED ORDER — EPHEDRINE 5 MG/ML INJ: 10.0000 mg | INTRAVENOUS | Status: DC | PRN

## 2022-11-03 MED ORDER — OXYTOCIN-SODIUM CHLORIDE 30-0.9 UT/500ML-% IV SOLN
2.5000 [IU]/h | INTRAVENOUS | Status: DC
  Administered 2022-11-04: 600 m[IU]/min via INTRAVENOUS
  Filled 2022-11-03: qty 500

## 2022-11-03 MED ORDER — LIDOCAINE HCL (PF) 1 % IJ SOLN
INTRAMUSCULAR | Status: DC | PRN
  Administered 2022-11-03: 3 mL

## 2022-11-03 MED ORDER — OXYTOCIN BOLUS FROM INFUSION: 333.0000 mL | Freq: Once | INTRAVENOUS | Status: DC

## 2022-11-03 MED ORDER — FENTANYL CITRATE (PF) 100 MCG/2ML IJ SOLN: 50.0000 ug | INTRAMUSCULAR | Status: DC | PRN

## 2022-11-03 MED ORDER — HYDROXYZINE HCL 25 MG PO TABS: 50.0000 mg | ORAL_TABLET | Freq: Four times a day (QID) | ORAL | Status: DC | PRN

## 2022-11-03 MED ORDER — FENTANYL-BUPIVACAINE-NACL 0.5-0.125-0.9 MG/250ML-% EP SOLN
EPIDURAL | Status: AC
  Filled 2022-11-03: qty 250

## 2022-11-03 MED ORDER — DIPHENHYDRAMINE HCL 50 MG/ML IJ SOLN: 12.5000 mg | INTRAMUSCULAR | Status: DC | PRN

## 2022-11-03 MED ORDER — LIDOCAINE-EPINEPHRINE (PF) 1.5 %-1:200000 IJ SOLN
INTRAMUSCULAR | Status: DC | PRN
  Administered 2022-11-03: 3 mL via EPIDURAL

## 2022-11-03 MED ORDER — LACTATED RINGERS IV SOLN: 500.0000 mL | Freq: Once | INTRAVENOUS | Status: DC

## 2022-11-03 MED ORDER — FENTANYL-BUPIVACAINE-NACL 0.5-0.125-0.9 MG/250ML-% EP SOLN: 12.0000 mL/h | EPIDURAL | Status: DC | PRN

## 2022-11-03 MED ORDER — ONDANSETRON HCL 4 MG/2ML IJ SOLN
4.0000 mg | Freq: Four times a day (QID) | INTRAMUSCULAR | Status: DC | PRN
  Filled 2022-11-03: qty 2

## 2022-11-03 MED ORDER — OXYCODONE-ACETAMINOPHEN 5-325 MG PO TABS: 2.0000 | ORAL_TABLET | ORAL | Status: DC | PRN

## 2022-11-03 MED ORDER — BUPIVACAINE HCL (PF) 0.25 % IJ SOLN
INTRAMUSCULAR | Status: DC | PRN
  Administered 2022-11-03: 1 mL via INTRATHECAL

## 2022-11-03 MED ORDER — FENTANYL-BUPIVACAINE-NACL 0.5-0.125-0.9 MG/250ML-% EP SOLN
EPIDURAL | Status: DC | PRN
  Administered 2022-11-03: 12 mL/h via EPIDURAL

## 2022-11-03 MED ORDER — LACTATED RINGERS IV SOLN
500.0000 mL | INTRAVENOUS | Status: DC | PRN
  Administered 2022-11-03: 1000 mL via INTRAVENOUS

## 2022-11-03 MED ORDER — ACETAMINOPHEN 325 MG PO TABS: 650.0000 mg | ORAL_TABLET | ORAL | Status: DC | PRN

## 2022-11-03 MED ORDER — LIDOCAINE HCL (PF) 1 % IJ SOLN: 30.0000 mL | INTRAMUSCULAR | Status: DC | PRN

## 2022-11-03 MED ORDER — SOD CITRATE-CITRIC ACID 500-334 MG/5ML PO SOLN: 30.0000 mL | ORAL | Status: DC | PRN

## 2022-11-03 NOTE — H&P (Signed)
History and Physical   HPI  Amanda Foley is a 37 y.o. G3P2002 at [redacted]w[redacted]d Estimated Date of Delivery: 10/21/22 by LMP c/w 8wk6d u/s, who is being admitted for labor management. Reports good fetal movement. Contractions started around 3pm, became more intense. She was planning a homebirth and being management by a midwife at home. She reports being complete and starting to push at 1830. Reports her water broke at 2000. Kept pushing until about 2030 and then decided to come to the hospital for an epidural.  Currently Amanda Foley feels she just needs rest and after getting an epidural, IV fluids and napping that she will have the strength to push out her baby. Currently declining interventions.    Pregnancy has been complicated by obesity, limited prenatal care, hx of two prior c/s, VNI.   Amanda Foley started prenatal care at [facility] but transferred to [facility] at 28 weeks. She had three prenatal visits at [facility]. Also had an anatomy scan at 20 weeks which showed anterior placenta and EFW of 88%. She had four visits at [facility], her last one was on 09/08/22 at 33wk6d. She was then planning a homebirth for this pregnancy and seeing a midwife based in [place]. She did go to main [facility] hospital yesterday requesting to be induced and to check the status of her baby. Staff there had said they would need a growth scan and baseline labs before proceeding with an IOL. Amanda Foley declined u/s and GBS labs, did consent to blood draw and ultimately decided to leave AMA.   During this pregnancy she has declined GDM screening, growth scans, anatomy scan, and GBS testing. She desired TOLAC and not to be induced until 41 weeks.   For her first pregnancy she went to [facility] and transferred for epidural and had a primary c/s for arrest of dilation in 2018, baby weighed 3605g. For her second pregnancy she saw a CPM and attempted a home TOLAC but eventually transferred to [facility] after SROM with [facility]SA[facility]. She received an  epidural, progressed to complete and +1 station. She pushed for four hours before having a c/s. That baby weighed 4040g.   When seen at main [facility] yesterday she had two reported elevated blood pressures of 140/81 and 139/92.  OB History  OB History  Gravida Para Term Preterm AB Living  3 2 2  0 0 2  SAB IAB Ectopic Multiple Live Births  0 0 0 0 2    # Outcome Date GA Lbr Len/2nd Weight Sex Type Anes PTL Lv  3 Current           2 Term 11/15/18 [redacted]w[redacted]d 16:59 / 09:07 4040 g F CS-LTranv EPI  LIV     Birth Comments: None     Name: Amanda Foley,Amanda Foley     Apgar1: 8  Apgar5: 9  1 Term 08/16/16 [redacted]w[redacted]d  3605 g F CS-LTranv EPI  LIV     Name: Amanda Foley,Amanda Foley     Apgar1: 9  Apgar5: 9    PROBLEM LIST  Pregnancy complications or risks: Patient Active Problem List   Diagnosis Date Noted   [redacted] weeks gestation of pregnancy 11/03/2022   History of 2 cesarean sections 08/25/2022   Patient desires vaginal birth after cesarean section (VBAC) 08/25/2022   Maternal varicella, non-immune 08/06/2022   Supervision of other high risk pregnancy, antepartum 08/01/2022   Morbid obesity with body mass index (BMI) of 40.0 or higher (HCC) 08/01/2022   Postoperative ileus (HCC) 11/18/2018  Prenatal labs and studies: ABO, Rh: --/--/O POS (09/25 1648) Antibody: NEG (09/25 1648) Rubella: 2.58 (09/25 1653) RPR: NON REACTIVE (09/25 1653)  HBsAg: NON REACTIVE (09/25 1653)  HIV: Non Reactive (09/25 1653)  GBS: declined H&H:  Hemoglobin  Date Value Ref Range Status  11/02/2022 12.1 12.0 - 15.0 g/dL Final  44/04/4740 7.5 (L) 12.0 - 15.0 g/dL Final  59/56/3875 8.9 (L) 12.0 - 15.0 g/dL Final  64/33/2951 88.4 (L) 12.0 - 15.0 g/dL Final   & hematocrit  GC/CT: neg/neg at NOB, declined later testing Early 1 hr 131, declined further testing  Declined growth scans.   Past Medical History:  Diagnosis Date   No pertinent past medical history      Past Surgical History:  Procedure Laterality Date    CESAREAN SECTION N/A 08/16/2016   Procedure: CESAREAN SECTION;  Surgeon: Olivia Mackie, MD;  Location: [facility] BIRTHING SUITES;  Service: Obstetrics;  Laterality: N/A;   CESAREAN SECTION     CESAREAN SECTION N/A 11/15/2018   Procedure: CESAREAN SECTION;  Surgeon: Lazaro Arms, MD;  Location: [facility] LD ORS;  Service: Obstetrics;  Laterality: N/A;   WISDOM TOOTH EXTRACTION       Medications    Current Discharge Medication List     CONTINUE these medications which have NOT CHANGED   Details  Prenatal Vit-Fe Fumarate-FA (PRENATAL MULTIVITAMIN) TABS tablet Take 1 tablet by mouth daily at 12 noon.    Accu-Chek Softclix Lancets lancets 1 each by Other route 4 (four) times daily. Check fasting, and then 1-2 hours after every meal Qty: 100 each, Refills: 12   Associated Diagnoses: Screening for diabetes mellitus; Supervision of high risk pregnancy in second trimester    Blood Glucose Monitoring Suppl (ACCU-CHEK GUIDE) w/Device KIT 1 kit by Does not apply route in the morning, at noon, in the evening, and at bedtime. Check blood sugars for fasting, and two hours after breakfast, lunch and dinner (4 checks daily) Qty: 1 kit, Refills: 0   Associated Diagnoses: Screening for diabetes mellitus; Supervision of high risk pregnancy in second trimester    glucose blood (ACCU-CHEK GUIDE) test strip 1 each by Other route in the morning, at noon, in the evening, and at bedtime. Check blood glucose four times a day: fasting and 2 hours after breakfast, lunch, and dinner Qty: 100 each, Refills: 6   Associated Diagnoses: Screening for diabetes mellitus; Supervision of high risk pregnancy in second trimester         Allergies  Almond (diagnostic), Gluten meal, and Lactose intolerance (gi)  Review of Systems  Pertinent items are noted in HPI.  Physical Exam  LMP 01/14/2022   Lungs:  CTA B Cardio: S1S2, RRR Abd: Soft, gravid, NT Presentation: cephalic DTRs: 2+ B SVE: 10cm/100%/0, vertex Dark  meconium stained fluid present  FHR 150, mod variability, pos accels, has had four late decels since arriving ago, and one variable decels Toco Ucs q34min   Test Results  No results found for this or any previous visit (from the past 24 hour(s)). GBS unknown  Assessment  G3P2002 at [redacted]w[redacted]d Estimated Date of Delivery: 10/21/22  Cat II strip Arrest of descent MSAF GBS unknown Suspected macrosomic baby  Patient Active Problem List   Diagnosis Date Noted   [redacted] weeks gestation of pregnancy 11/03/2022   History of 2 cesarean sections 08/25/2022   Patient desires vaginal birth after cesarean section (VBAC) 08/25/2022   Maternal varicella, non-immune 08/06/2022   Supervision of other high risk pregnancy, antepartum  08/01/2022   Morbid obesity with body mass index (BMI) of 40.0 or higher (HCC) 08/01/2022   Postoperative ileus (HCC) 11/18/2018    Plan  1. Admit to L&D   2. EFM per unit policy 3. Labs : T&S, CBC, RPR, GBS 4. Dr. Dalbert Garnet notified of patient situation and in on her way in due to TOLAC 5. Patient declined c/s currently. Would like epidural and to rest and then resume pushing. Did discuss Cat II strip with patient as well, currently there is still good variability and accels but did discuss baby having several late decels and that being a sign that he is not fully tolerating labor. Patient wants to continue with her plan of epidural and rest.  Raeford Razor, CNM, FNP 11/03/2022 9:25 PM

## 2022-11-03 NOTE — Anesthesia Procedure Notes (Signed)
Epidural Patient location during procedure: OB Start time: 11/03/2022 10:01 PM End time: 11/03/2022 10:20 PM  Staffing Anesthesiologist: Foye Deer, MD Performed: anesthesiologist   Preanesthetic Checklist Completed: patient identified, IV checked, site marked, risks and benefits discussed, surgical consent, monitors and equipment checked, pre-op evaluation and timeout performed  Epidural Patient position: sitting Prep: ChloraPrep Patient monitoring: heart rate, continuous pulse ox and blood pressure Approach: midline Location: L2-L3 Injection technique: LOR saline  Needle:  Needle type: Tuohy  Needle gauge: 18 G Needle length: 9 cm Needle insertion depth: 8 cm Catheter type: closed end Catheter size: 20 Guage Catheter at skin depth: 13 cm Test dose: negative and 1.5% lidocaine with Epi 1:200 K  Assessment Events: blood aspirated, no cerebrospinal fluid, injection not painful, no injection resistance and no paresthesia  Additional Notes CSE attempted at L3/4 with spinal dose placed. Epidural threaded into vasculature with blood aspiration. It was removed and an epidural was placed uneventfully at L2/3Reason for block:procedure for pain

## 2022-11-03 NOTE — Anesthesia Preprocedure Evaluation (Addendum)
Anesthesia Evaluation  Patient identified by MRN, date of birth, ID band Patient awake    Reviewed: Allergy & Precautions, H&P , NPO status , Patient's Chart, lab work & pertinent test results  Airway Mallampati: II  TM Distance: >3 FB Neck ROM: full    Dental no notable dental hx.    Pulmonary neg pulmonary ROS   Pulmonary exam normal        Cardiovascular Exercise Tolerance: Good negative cardio ROS Normal cardiovascular exam     Neuro/Psych    GI/Hepatic negative GI ROS,,,  Endo/Other    Renal/GU   negative genitourinary   Musculoskeletal   Abdominal  (+) + obese  Peds  Hematology negative hematology ROS (+)   Anesthesia Other Findings 2 prior CD via epidural. Pt has been pushing fully dilated at home for an extended period of time and wants to continue with vaginal birth attempt.   Past Medical History: No date: No pertinent past medical history  Past Surgical History: 08/16/2016: CESAREAN SECTION; N/A     Comment:  Procedure: CESAREAN SECTION;  Surgeon: Olivia Mackie,               MD;  Location: [facility] BIRTHING SUITES;  Service: Obstetrics;               Laterality: N/A; No date: CESAREAN SECTION 11/15/2018: CESAREAN SECTION; N/A     Comment:  Procedure: CESAREAN SECTION;  Surgeon: Lazaro Arms,               MD;  Location: [facility] LD ORS;  Service: Obstetrics;                Laterality: N/A; No date: WISDOM TOOTH EXTRACTION  BMI    Body Mass Index: 47.37 kg/m      Reproductive/Obstetrics (+) Pregnancy                             Anesthesia Physical Anesthesia Plan  ASA: 3  Anesthesia Plan: Epidural   Post-op Pain Management:    Induction:   PONV Risk Score and Plan:   Airway Management Planned:   Additional Equipment:   Intra-op Plan:   Post-operative Plan:   Informed Consent: I have reviewed the patients History and Physical, chart, labs and discussed the  procedure including the risks, benefits and alternatives for the proposed anesthesia with the patient or authorized representative who has indicated his/her understanding and acceptance.       Plan Discussed with: Anesthesiologist and CRNA  Anesthesia Plan Comments: (Pt consented for CD via epidural with back-up GETA. )        Anesthesia Quick Evaluation

## 2022-11-03 NOTE — Progress Notes (Signed)
To the bedside for discussion. [name] is 37 yo G3P2002 at 41+6wks by LMP and 8wk ultrasound with desire to TOLACx2 at home but now pushing since 1830-2030 with thick mec and SROM at 2000 at home. Pt exhausted and desires epidural for pain control with plans to rest and resume pushing.  GBS unknown, possible gDM but has not been screened in 3rd trimester, gHTN by two mild pressures at OSH with normal labs, AMA. Prior c/s for arrest of descent and failed induction.   She did receive anatomy scan at [facility] with this pregnancy at [redacted]w[redacted]d, somewhat limited. Did decline further growth scans.  Please see H&P for details.  Pt now comfortable with epidural. She is having subtle lates with mod variability at this time. Last acceleration at 22:16. She declines repeat c/s at this time, and plans to rest after getting good pain control. She has been hydrated, is contracting q39min.   I recommend proceeding with repeat c/s delivery at this time, as Cat II strip with late decels. Pt is not interested in expedited delivery at this time. She is aware of the risks of neuro damage and fetal compromise, including death.  Discussed with family and nursing staff in the room.

## 2022-11-04 ENCOUNTER — Encounter
Admission: EM | Disposition: A | Discharge status: Home / Self Care | Payer: Self-pay | Source: Home / Self Care | Attending: Obstetrics and Gynecology

## 2022-11-04 ENCOUNTER — Encounter: Payer: Self-pay | Admitting: Family

## 2022-11-04 DIAGNOSIS — O99214 Obesity complicating childbirth: Secondary | ICD-10-CM

## 2022-11-04 DIAGNOSIS — O34211 Maternal care for low transverse scar from previous cesarean delivery: Secondary | ICD-10-CM

## 2022-11-04 DIAGNOSIS — O0933 Supervision of pregnancy with insufficient antenatal care, third trimester: Secondary | ICD-10-CM

## 2022-11-04 DIAGNOSIS — E669 Obesity, unspecified: Secondary | ICD-10-CM

## 2022-11-04 LAB — CREATININE, SERUM
Creatinine, Ser: 0.85 mg/dL (ref 0.44–1.00)
GFR, Estimated: >60 mL/min (ref >60)

## 2022-11-04 LAB — CBC
HCT: 30.3 % — ABNORMAL LOW (ref 36.0–46.0)
Hemoglobin: 10.1 g/dL — ABNORMAL LOW (ref 12.0–15.0)
MCH: 27.3 pg (ref 26.0–34.0)
MCHC: 33.3 g/dL (ref 30.0–36.0)
MCV: 81.9 fL (ref 80.0–100.0)
Platelets: 231 10*3/uL (ref 150–400)
RBC: 3.7 MIL/uL — ABNORMAL LOW (ref 3.87–5.11)
RDW: 14.6 % (ref 11.5–15.5)
WBC: 18.3 10*3/uL — ABNORMAL HIGH (ref 4.0–10.5)
nRBC: 0 % (ref 0.0–0.2)

## 2022-11-04 LAB — RPR: RPR Ser Ql: NONREACTIVE

## 2022-11-04 SURGERY — Surgical Case
Anesthesia: Epidural

## 2022-11-04 MED ORDER — MEASLES, MUMPS & RUBELLA VAC IJ SOLR
0.5000 mL | Freq: Once | INTRAMUSCULAR | Status: DC
  Filled 2022-11-04: qty 0.5

## 2022-11-04 MED ORDER — FENTANYL CITRATE (PF) 100 MCG/2ML IJ SOLN
INTRAMUSCULAR | Status: AC
  Filled 2022-11-04: qty 2

## 2022-11-04 MED ORDER — KETOROLAC TROMETHAMINE 30 MG/ML IJ SOLN
30.0000 mg | Freq: Four times a day (QID) | INTRAMUSCULAR | Status: AC
  Administered 2022-11-04: 30 mg via INTRAVENOUS
  Filled 2022-11-04 (×2): qty 1

## 2022-11-04 MED ORDER — ONDANSETRON HCL 4 MG/2ML IJ SOLN
4.0000 mg | Freq: Three times a day (TID) | INTRAMUSCULAR | Status: DC | PRN
  Filled 2022-11-04: qty 2

## 2022-11-04 MED ORDER — METOCLOPRAMIDE HCL 5 MG/ML IJ SOLN
10.0000 mg | Freq: Once | INTRAMUSCULAR | Status: DC
  Filled 2022-11-04: qty 2

## 2022-11-04 MED ORDER — NALOXONE HCL 0.4 MG/ML IJ SOLN: 0.4000 mg | INTRAMUSCULAR | Status: DC | PRN

## 2022-11-04 MED ORDER — LIDOCAINE HCL (PF) 2 % IJ SOLN
INTRAMUSCULAR | Status: AC
  Filled 2022-11-04: qty 15

## 2022-11-04 MED ORDER — CHLORHEXIDINE GLUCONATE 0.12 % MT SOLN
OROMUCOSAL | Status: AC
  Filled 2022-11-04: qty 15

## 2022-11-04 MED ORDER — BUPIVACAINE HCL (PF) 0.5 % IJ SOLN
INTRAMUSCULAR | Status: DC | PRN
  Administered 2022-11-04: 60 mL

## 2022-11-04 MED ORDER — TETANUS-DIPHTH-ACELL PERTUSSIS 5-2.5-18.5 LF-MCG/0.5 IM SUSY: 0.5000 mL | PREFILLED_SYRINGE | Freq: Once | INTRAMUSCULAR | Status: DC

## 2022-11-04 MED ORDER — CEFAZOLIN SODIUM-DEXTROSE 2-3 GM-%(50ML) IV SOLR
INTRAVENOUS | Status: DC | PRN
Start: 2022-11-04 — End: 2022-11-04
  Administered 2022-11-04: 2 g via INTRAVENOUS

## 2022-11-04 MED ORDER — NALOXONE HCL 4 MG/10ML IJ SOLN: 1.0000 ug/kg/h | INTRAVENOUS | Status: DC | PRN

## 2022-11-04 MED ORDER — KETOROLAC TROMETHAMINE 30 MG/ML IJ SOLN
INTRAMUSCULAR | Status: AC
  Filled 2022-11-04: qty 1

## 2022-11-04 MED ORDER — KETOROLAC TROMETHAMINE 30 MG/ML IJ SOLN
INTRAMUSCULAR | Status: DC | PRN
  Administered 2022-11-04: 30 mg via INTRAVENOUS

## 2022-11-04 MED ORDER — LACTATED RINGERS IV SOLN: INTRAVENOUS | Status: DC

## 2022-11-04 MED ORDER — WITCH HAZEL-GLYCERIN EX PADS: 1.0000 | MEDICATED_PAD | CUTANEOUS | Status: DC | PRN

## 2022-11-04 MED ORDER — BISACODYL 10 MG RE SUPP: 10.0000 mg | Freq: Every day | RECTAL | Status: DC | PRN

## 2022-11-04 MED ORDER — FENTANYL CITRATE (PF) 100 MCG/2ML IJ SOLN
INTRAMUSCULAR | Status: DC | PRN
  Administered 2022-11-04: 100 ug via INTRATHECAL

## 2022-11-04 MED ORDER — ACETAMINOPHEN 500 MG PO TABS: 1000.0000 mg | ORAL_TABLET | Freq: Four times a day (QID) | ORAL | Status: DC

## 2022-11-04 MED ORDER — SIMETHICONE 80 MG PO CHEW
80.0000 mg | CHEWABLE_TABLET | ORAL | Status: DC | PRN
  Administered 2022-11-04 – 2022-11-05 (×2): 80 mg via ORAL
  Filled 2022-11-04 (×2): qty 1

## 2022-11-04 MED ORDER — SENNOSIDES-DOCUSATE SODIUM 8.6-50 MG PO TABS
2.0000 | ORAL_TABLET | ORAL | Status: DC
  Filled 2022-11-04: qty 2

## 2022-11-04 MED ORDER — SCOPOLAMINE 1 MG/3DAYS TD PT72: 1.0000 | MEDICATED_PATCH | Freq: Once | TRANSDERMAL | Status: DC

## 2022-11-04 MED ORDER — KETOROLAC TROMETHAMINE 30 MG/ML IJ SOLN: 30.0000 mg | Freq: Four times a day (QID) | INTRAMUSCULAR | Status: AC | PRN

## 2022-11-04 MED ORDER — FERROUS SULFATE 325 (65 FE) MG PO TABS
325.0000 mg | ORAL_TABLET | Freq: Two times a day (BID) | ORAL | Status: DC
  Administered 2022-11-04 (×2): 325 mg via ORAL
  Filled 2022-11-04 (×3): qty 1

## 2022-11-04 MED ORDER — FLEET ENEMA RE ENEM: 1.0000 | ENEMA | Freq: Every day | RECTAL | Status: DC | PRN

## 2022-11-04 MED ORDER — OXYCODONE HCL 5 MG PO TABS
5.0000 mg | ORAL_TABLET | ORAL | Status: DC | PRN
  Administered 2022-11-04: 5 mg via ORAL
  Administered 2022-11-04: 10 mg via ORAL
  Administered 2022-11-05: 5 mg via ORAL
  Filled 2022-11-04: qty 2
  Filled 2022-11-04: qty 1
  Filled 2022-11-04 (×2): qty 2

## 2022-11-04 MED ORDER — SODIUM CHLORIDE 0.9 % IV SOLN
INTRAVENOUS | Status: AC
  Filled 2022-11-04: qty 5

## 2022-11-04 MED ORDER — OXYTOCIN-SODIUM CHLORIDE 30-0.9 UT/500ML-% IV SOLN: 2.5000 [IU]/h | INTRAVENOUS | Status: AC

## 2022-11-04 MED ORDER — PHENYLEPHRINE HCL-NACL 20-0.9 MG/250ML-% IV SOLN
INTRAVENOUS | Status: AC
  Filled 2022-11-04: qty 250

## 2022-11-04 MED ORDER — DEXMEDETOMIDINE HCL IN NACL 80 MCG/20ML IV SOLN
INTRAVENOUS | Status: AC
  Filled 2022-11-04: qty 20

## 2022-11-04 MED ORDER — DIPHENHYDRAMINE HCL 50 MG/ML IJ SOLN: 12.5000 mg | INTRAMUSCULAR | Status: DC | PRN

## 2022-11-04 MED ORDER — DIPHENHYDRAMINE HCL 25 MG PO CAPS: 25.0000 mg | ORAL_CAPSULE | ORAL | Status: DC | PRN

## 2022-11-04 MED ORDER — DIPHENHYDRAMINE HCL 25 MG PO CAPS: 25.0000 mg | ORAL_CAPSULE | Freq: Four times a day (QID) | ORAL | Status: DC | PRN

## 2022-11-04 MED ORDER — OXYTOCIN-SODIUM CHLORIDE 30-0.9 UT/500ML-% IV SOLN
INTRAVENOUS | Status: AC
  Filled 2022-11-04: qty 500

## 2022-11-04 MED ORDER — PRENATAL MULTIVITAMIN CH
1.0000 | ORAL_TABLET | Freq: Every day | ORAL | Status: DC
  Administered 2022-11-04: 1 via ORAL
  Filled 2022-11-04: qty 1

## 2022-11-04 MED ORDER — SODIUM CHLORIDE 0.9% FLUSH: 3.0000 mL | INTRAVENOUS | Status: DC | PRN

## 2022-11-04 MED ORDER — GABAPENTIN 300 MG PO CAPS: 300.0000 mg | ORAL_CAPSULE | Freq: Every day | ORAL | Status: DC

## 2022-11-04 MED ORDER — IBUPROFEN 600 MG PO TABS
ORAL_TABLET | ORAL | Status: AC
  Administered 2022-11-04: 600 mg
  Filled 2022-11-04: qty 1

## 2022-11-04 MED ORDER — IBUPROFEN 600 MG PO TABS
600.0000 mg | ORAL_TABLET | Freq: Four times a day (QID) | ORAL | Status: DC
  Administered 2022-11-04 – 2022-11-05 (×3): 600 mg via ORAL
  Filled 2022-11-04 (×3): qty 1

## 2022-11-04 MED ORDER — CEFAZOLIN SODIUM-DEXTROSE 2-4 GM/100ML-% IV SOLN
INTRAVENOUS | Status: AC
  Filled 2022-11-04: qty 100

## 2022-11-04 MED ORDER — DEXMEDETOMIDINE HCL IN NACL 80 MCG/20ML IV SOLN
INTRAVENOUS | Status: DC | PRN
Start: 2022-11-04 — End: 2022-11-04
  Administered 2022-11-04: 4 ug via INTRAVENOUS
  Administered 2022-11-04: 8 ug via INTRAVENOUS

## 2022-11-04 MED ORDER — ACETAMINOPHEN 500 MG PO TABS
1000.0000 mg | ORAL_TABLET | Freq: Four times a day (QID) | ORAL | Status: DC
  Administered 2022-11-04 – 2022-11-05 (×3): 1000 mg via ORAL
  Filled 2022-11-04 (×3): qty 2

## 2022-11-04 MED ORDER — METHYLERGONOVINE MALEATE 0.2 MG/ML IJ SOLN
INTRAMUSCULAR | Status: AC
  Filled 2022-11-04: qty 1

## 2022-11-04 MED ORDER — SOD CITRATE-CITRIC ACID 500-334 MG/5ML PO SOLN
ORAL | Status: AC
  Filled 2022-11-04: qty 15

## 2022-11-04 MED ORDER — ENOXAPARIN SODIUM 40 MG/0.4ML IJ SOSY
40.0000 mg | PREFILLED_SYRINGE | INTRAMUSCULAR | Status: DC
  Filled 2022-11-04: qty 0.4

## 2022-11-04 MED ORDER — COCONUT OIL OIL: 1.0000 | TOPICAL_OIL | Status: DC | PRN

## 2022-11-04 MED ORDER — BUPIVACAINE HCL (PF) 0.5 % IJ SOLN
INTRAMUSCULAR | Status: AC
  Filled 2022-11-04: qty 60

## 2022-11-04 MED ORDER — LIDOCAINE-EPINEPHRINE (PF) 2 %-1:200000 IJ SOLN
INTRAMUSCULAR | Status: DC | PRN
Start: 2022-11-04 — End: 2022-11-04
  Administered 2022-11-04 (×4): 5 mL via INTRADERMAL

## 2022-11-04 MED ORDER — OXYCODONE HCL 5 MG PO TABS: 5.0000 mg | ORAL_TABLET | Freq: Four times a day (QID) | ORAL | Status: DC | PRN

## 2022-11-04 MED ORDER — DIBUCAINE (PERIANAL) 1 % EX OINT: 1.0000 | TOPICAL_OINTMENT | CUTANEOUS | Status: DC | PRN

## 2022-11-04 MED ORDER — SIMETHICONE 80 MG PO CHEW
80.0000 mg | CHEWABLE_TABLET | Freq: Three times a day (TID) | ORAL | Status: DC
  Administered 2022-11-04 – 2022-11-05 (×4): 80 mg via ORAL
  Filled 2022-11-04 (×4): qty 1

## 2022-11-04 MED ORDER — TRANEXAMIC ACID-NACL 1000-0.7 MG/100ML-% IV SOLN
INTRAVENOUS | Status: AC
  Filled 2022-11-04: qty 100

## 2022-11-04 MED ORDER — ONDANSETRON HCL 4 MG/2ML IJ SOLN: 4.0000 mg | Freq: Once | INTRAMUSCULAR | Status: DC

## 2022-11-04 MED ORDER — MENTHOL 3 MG MT LOZG: 1.0000 | LOZENGE | OROMUCOSAL | Status: DC | PRN

## 2022-11-04 SURGICAL SUPPLY — 33 items
APL PRP STRL LF DISP 70% ISPRP (MISCELLANEOUS) ×1
BARRIER ADHS 3X4 INTERCEED (GAUZE/BANDAGES/DRESSINGS) IMPLANT
BRR ADH 4X3 ABS CNTRL BYND (GAUZE/BANDAGES/DRESSINGS) ×1
CHLORAPREP W/TINT 26 (MISCELLANEOUS) ×1 IMPLANT
DRSG TELFA 3X8 NADH STRL (GAUZE/BANDAGES/DRESSINGS) ×1 IMPLANT
ELECT REM PT RETURN 9FT ADLT (ELECTROSURGICAL) ×1
ELECTRODE REM PT RTRN 9FT ADLT (ELECTROSURGICAL) ×1 IMPLANT
GAUZE SPONGE 4X4 12PLY STRL (GAUZE/BANDAGES/DRESSINGS) ×1 IMPLANT
GOWN STRL REUS W/ TWL LRG LVL3 (GOWN DISPOSABLE) ×3 IMPLANT
GOWN STRL REUS W/TWL LRG LVL3 (GOWN DISPOSABLE) ×3
MANIFOLD NEPTUNE II (INSTRUMENTS) ×1 IMPLANT
MAT PREVALON FULL STRYKER (MISCELLANEOUS) ×1 IMPLANT
NDL HYPO 25GX1X1/2 BEV (NEEDLE) ×1 IMPLANT
NEEDLE HYPO 25GX1X1/2 BEV (NEEDLE) ×1
NS IRRIG 1000ML POUR BTL (IV SOLUTION) ×1 IMPLANT
PACK C SECTION AR (MISCELLANEOUS) ×1 IMPLANT
PAD OB MATERNITY 4.3X12.25 (PERSONAL CARE ITEMS) ×1 IMPLANT
PAD PREP OB/GYN DISP 24X41 (PERSONAL CARE ITEMS) ×1 IMPLANT
RETRACTOR TRAXI PANNICULUS (MISCELLANEOUS) IMPLANT
RTRCTR C-SECT PINK 25CM LRG (MISCELLANEOUS) IMPLANT
SCRUB CHG 4% DYNA-HEX 4OZ (MISCELLANEOUS) ×1 IMPLANT
STAPLER INSORB 30 2030 C-SECTI (MISCELLANEOUS) IMPLANT
SUT MNCRL 4-0 (SUTURE) ×1
SUT MNCRL 4-0 27XMFL (SUTURE) ×1
SUT VIC AB 0 CT1 36 (SUTURE) ×2 IMPLANT
SUT VIC AB 0 CTX 36 (SUTURE) ×2
SUT VIC AB 0 CTX36XBRD ANBCTRL (SUTURE) ×2 IMPLANT
SUT VIC AB 2-0 SH 27 (SUTURE) ×2
SUT VIC AB 2-0 SH 27XBRD (SUTURE) ×2 IMPLANT
SUTURE MNCRL 4-0 27XMF (SUTURE) ×1 IMPLANT
SYR 30ML LL (SYRINGE) ×2 IMPLANT
TRAP FLUID SMOKE EVACUATOR (MISCELLANEOUS) ×1 IMPLANT
WATER STERILE IRR 500ML POUR (IV SOLUTION) ×1 IMPLANT

## 2022-11-04 NOTE — Transfer of Care (Signed)
Immediate Anesthesia Transfer of Care Note  Patient: Amanda Foley  Procedure(s) Performed: CESAREAN SECTION  Patient Location: PACU and Mother/Baby  Anesthesia Type:Epidural  Level of Consciousness: awake, alert , and oriented  Airway & Oxygen Therapy: room air  Post-op Assessment: Report given to RN and Post -op Vital signs reviewed and stable  Post vital signs: Reviewed and stable  Last Vitals:  Vitals Value Taken Time  BP 110/88 11/04/22 0337  Temp    Pulse 99 11/04/22 0338  Resp 27 11/04/22 0338  SpO2 97 % 11/04/22 0338  Vitals shown include unfiled device data.  Last Pain:  Vitals:   11/03/22 2139  PainSc: 10-Worst pain ever      Patients Stated Pain Goal: 0 (11/03/22 2139)  Complications: No notable events documented.

## 2022-11-04 NOTE — Progress Notes (Signed)
  Labor Progress Note   37 y.o. [id number] @ [redacted]w[redacted]d   Subjective:  Makinzey feels more comfortable after getting her epidural, can still feel contractions but they are not as intense. She also had a milkshake.   Objective:  BP 134/76   Pulse 88   Ht 5\' 1"  (1.549 m)   Wt 113.7 kg   LMP 01/14/2022   SpO2 94%   BMI 47.37 kg/m  Abd: gravid SVE: 10cm/100%/0, increased capt since last check  EFM: 140, min to mod variability, some accels, some late decels, some early decels, late decels not with every contractions Toco: Ucs q3-42min   Assessment  G3P2002 @ [redacted]w[redacted]d Arrest of descent VSS FHR Cat II   Plan:   1. Previous plan was for patient to rest once comfortable and then begin pushing again. On arrival c/s was recommended due to arrest of descent and Cat II strip, patient had declined. We have had had numerous conversations about risks. And repeatedly recommended c/s. Now with seeing recurrent late decels and variability being minimal at times, Juliann recognizes that her baby is not tolerating labor and she states she is now comfortable with a c/s. 2. Called to [place] to inform that patient now consents for c/s 3. Will start preparing for c/s for delivery 4. See operative notes for further details  All discussed with patient  Raeford Razor CNM, FNP [facility] 11/04/2022  1:14 AM

## 2022-11-04 NOTE — Progress Notes (Signed)
-   prior cesarean x2 - failed TOLAC at completely dilated - arrest of descent - incomplete prenatal care - Cat II strip, remote from delivery - CPD  The risks of cesarean section discussed with the patient included but were not limited to: bleeding which may require transfusion or reoperation; infection which may require antibiotics; injury to bowel, bladder, ureters or other surrounding organs; injury to the fetus; need for additional procedures including hysterectomy in the event of a life-threatening hemorrhage; placental abnormalities wth subsequent pregnancies, incisional problems, thromboembolic phenomenon and other postoperative/anesthesia complications. The patient concurred with the proposed plan, giving informed written consent for the procedure. Anesthesia and OR aware. Preoperative prophylactic antibiotics and SCDs ordered on call to the OR.  To OR when ready.

## 2022-11-04 NOTE — Lactation Note (Addendum)
This note was copied from a baby's chart. Lactation Consultation Note  Patient Name: Amanda Foley [id number] Date: 11/04/2022 Age:37 hours Reason for consult: Initial assessment;Term   Maternal Data This is mom's 3rd baby, C/S for arrest of descent and fetal intolerance of labor. Mom had attempted home birth.Mom with history of obesity and limited prenatal care. Mom is an experienced breastfeeding mother.   At initial visit mom reports her nipples are sore as baby has a tongue and lip tie. Per mom her other children also had ties that were lasered. She plans on having this baby's upper lip and tongue tie lasered. Per mom baby is latching and breastfeeding. At the last feed mom initiated use of a nipple shield. She has used them in the past. At this time her plan is temporary use of the shield until the baby's lip and tongue tie are fixed. Mom is using a #20 mm shield. Discussed with mom if mom consistently uses the shield in the first 14 days to post pump 4-6 times in 24 hours as the nipple shield can decrease stimulation and potentially impact establishing an adequate milk supply. Mom using silverette cups for sore nipples. Has patient been taught Hand Expression?: Yes Does the patient have breastfeeding experience prior to this delivery?: Yes How long did the patient breastfeed?: Mom breastfed one child for 1 year and her other child for 3 years.  Feeding Mother's Current Feeding Choice: Breast Milk  LATCH Score Latch: Grasps breast easily, tongue down, lips flanged, rhythmical sucking.  Audible Swallowing: Spontaneous and intermittent (several audible swallows were noted)  Type of Nipple: Everted at rest and after stimulation  Comfort (Breast/Nipple): Filling, red/small blisters or bruises, mild/mod discomfort  Hold (Positioning): No assistance needed to correctly position infant at breast.  LATCH Score: 9   Lactation Tools Discussed/Used Tools: Nipple Shields Nipple shield  size: 20 (Despite tongue and lip baby latches directly to the breast however mom reports tender nipples and has begun using a #20 mm nipple shield. Mom has used nipple shields with previous babies. Mom's goal at this point is temporary use of the shield.)  Interventions Interventions: Breast feeding basics reviewed;Breast massage;Support pillows;Education  Discharge Pump:  (per mom she has access to wearable hands free breastpump.)  Consult Status Consult Status: Follow-up Date: 11/05/22 Follow-up type: In-patient  Update provided to care nurse.  Fuller Song 11/04/2022, 6:32 PM

## 2022-11-04 NOTE — Progress Notes (Signed)
Pt IV infiltrated when going to give dose of toradol. RN explained that the MD would be communicated with in regards to starting a dose of PO ibuprofen instead of toradol. Carie Caddy suggested patient get an IV to take toradol for the better coverage over pain but when RN went to discuss with pt, pt stated that she "already took her own ibuprofen because she was in so much pain and couldn't stand it."  RN had previously offered a dose of roxicodone but pt was weary due to previous history of an ileus.  RN offered roxi again to pt which she agreed to. She also would like to stagger her ibuprofen and tylenol to give her continuous pain coverage.   Pt also states she is taking her own magnesium to help with movement of her bowels in hopes to not have another ileus.

## 2022-11-04 NOTE — Op Note (Signed)
Cesarean Section Procedure Note  Date of procedure: 11/04/2022   Pre-operative Diagnosis: Intrauterine pregnancy at [redacted]w[redacted]d;   - prior cesarean x2 - failed TOLAC at completely dilated - arrest of descent - incomplete prenatal care - Cat II strip, remote from delivery - CPD      Body mass index is 47.37 kg/m.  Post-operative Diagnosis: same, delivered. **Modified 22 for difficult case due to habitus, completely dilated TOLAC with a tight bandls ring and Cat II strip  Procedure: Repeat Low Transverse Cesarean Section through Pfannenstiel incision  Surgeon: Christeen Douglas, MD  Assistant(s):  Raeford Razor, CNM  An experienced assistant was required given the standard of surgical care given the complexity of the case.  This assistant was needed for exposure, dissection, suctioning, retraction, instrument exchange,  [place] CNM assisting with delivery with administration of fundal pressure, and for overall help during the procedure.   Anesthesia: Epidural anesthesia  Anesthesiologist: Foye Deer, MD Anesthesiologist: Foye Deer, MD CRNA: Omer Jack, CRNA  Estimated Blood Loss:           Drains: Foley Cath  Dressings: Pressure dressing         Total IV Fluids:  Urine Output:         Specimens: Cord blood for O+ mom         Complications:  None; patient tolerated the procedure well.         Disposition: PACU - hemodynamically stable.         Condition: stable  Findings:  A female infant "Clydene Pugh" in OP presentation. Amniotic fluid - Meconium, very thick Birth weight 4480 g.   APGAR (1 MIN): 8  APGAR (5 MINS): 9  APGAR (10 MINS):    Intact placenta with a three-vessel cord.  Grossly normal tubes and ovaries bilaterally. Moderate lateral intraabdominal adhesions were noted. Tight Bandl's ring, bulging LUS.  Case was difficult due to pannus positioning. Traxi used.  Indications: cephalo-pelvic disproportion, failure to  progress: arrest of descent, and non-reassuring fetal status  Procedure Details  The patient was taken to Operating Room, identified as the correct patient and the procedure verified as C-Section Delivery. A formal Time Out was held with all team members present and in agreement.  After induction of anesthesia, the patient was draped and prepped in the usual sterile manner. A Traxi pannus retractor was placed. A Pfannenstiel skin incision was made and carried down through the subcutaneous tissue to the fascia. Fascial incision was made and extended transversely with the Mayo scissors. The fascia was separated from the underlying rectus tissue superiorly and inferiorly. The peritoneum was identified and entered bluntly. Peritoneal incision was extended longitudinally. The utero-vesical peritoneal reflection was incised transversely and a bladder flap was created digitally.   A low transverse hysterotomy was made. The fetus was delivered atraumatically. The umbilical cord was clamped x2 and cut and the infant was handed to the awaiting pediatricians. The placenta was removed intact and appeared normal, intact, and with a 3-vessel cord.   The uterus was exteriorized and cleared of all clot and debris. The hysterotomy was closed with running sutures of 0-Vicryl. A second imbricating layer was placed with the same suture. Excellent hemostasis was observed. The peritoneal cavity was cleared of all clots and debris. The uterus was returned to the abdomen.   The pelvis was examined and again, excellent hemostasis was noted. The fascia was then reapproximated with running sutures of 0 Vicryl.  The subcutaneous tissue was reapproximated with  running sutures of 0 Maxon. The skin was reapproximated with Ensorb absorbable sutures. 0.5% with NSS bupivicaine placed in each of the fascial and skin lines.  Instrument, sponge, and needle counts were correct prior to the abdominal closure and at the conclusion of the  case.   The patient tolerated the procedure well and was transferred to the recovery room in stable condition.   Christeen Douglas, MD 11/04/2022

## 2022-11-05 MED ORDER — OXYCODONE HCL 5 MG PO TABS: 5.0000 mg | ORAL_TABLET | Freq: Four times a day (QID) | ORAL | 0 refills | Status: AC | PRN

## 2022-11-05 MED ORDER — SIMETHICONE 80 MG PO CHEW: 80.0000 mg | CHEWABLE_TABLET | ORAL | Status: AC | PRN

## 2022-11-05 MED ORDER — ACETAMINOPHEN 500 MG PO TABS: 1000.0000 mg | ORAL_TABLET | Freq: Four times a day (QID) | ORAL | Status: AC

## 2022-11-05 MED ORDER — FERROUS SULFATE 325 (65 FE) MG PO TABS: 325.0000 mg | ORAL_TABLET | Freq: Two times a day (BID) | ORAL | Status: AC

## 2022-11-05 MED ORDER — ONDANSETRON 4 MG PO TBDP
4.0000 mg | ORAL_TABLET | Freq: Four times a day (QID) | ORAL | Status: DC | PRN
  Administered 2022-11-05: 4 mg via ORAL
  Filled 2022-11-05: qty 1

## 2022-11-05 MED ORDER — IBUPROFEN 600 MG PO TABS: 600.0000 mg | ORAL_TABLET | Freq: Four times a day (QID) | ORAL | Status: AC

## 2022-11-05 MED ORDER — SENNOSIDES-DOCUSATE SODIUM 8.6-50 MG PO TABS: 2.0000 | ORAL_TABLET | ORAL | Status: AC

## 2022-11-05 NOTE — Anesthesia Postprocedure Evaluation (Signed)
Anesthesia Post Note  Patient: Venita Cauble  Procedure(s) Performed: AN AD HOC LABOR EPIDURAL  Patient location during evaluation: Mother Baby Anesthesia Type: Epidural Level of consciousness: awake and alert Pain management: pain level controlled Vital Signs Assessment: post-procedure vital signs reviewed and stable Respiratory status: spontaneous breathing, nonlabored ventilation and respiratory function stable Cardiovascular status: stable Postop Assessment: no headache, no backache, patient able to bend at knees and able to ambulate Anesthetic complications: no   No notable events documented.   Last Vitals:  Vitals:   11/04/22 2340 11/05/22 0517  BP: 133/84 (!) 126/91  Pulse: (!) 102 (!) 105  Resp: 20 18  Temp: 37 C 37.2 C  SpO2: 99% 99%    Last Pain:  Vitals:   11/05/22 0617  TempSrc:   PainSc: Asleep                 Cleda Mccreedy Klee Kolek

## 2022-11-05 NOTE — Progress Notes (Signed)
Patient discharged home with infant. Discharge instructions and prescriptions given and reviewed with patient. Patient verbalized understanding. Escorted out by volunteer.  

## 2022-11-05 NOTE — Discharge Instructions (Signed)
Discharge Instructions:  ° °Follow-up Appointment:  ° °If there are any new medications, they have been ordered and will be available for pickup at the listed pharmacy on your way home from the hospital.  ° °Call office if you have any of the following: headache, visual changes, fever >101.0 F, chills, shortness of breath, breast concerns, excessive vaginal bleeding, incision drainage or problems, leg pain or redness, depression or any other concerns. If you have vaginal discharge with an odor, let your doctor know.  ° °It is normal to bleed for up to 6 weeks. You should not soak through more than 1 pad in 1 hour. If you have a blood clot larger than your fist with continued bleeding, call your doctor.  ° °After a c-section, you should expect a small amount of blood or clear fluid coming from the incision and abdominal cramping/soreness. Inspect your incision site daily. Stand in front of a mirror to look for any redness, incision opening, or discolored/odorness drainage. Take a shower daily and continue good hygiene. Use own towel and washcloth (do not share). Make sure your sheets on your bed are clean. No pets sleeping around your incision site. Dressing will be removed at your postpartum visit. If the dressing does become wet or soiled underneath, it is okay to remove it.  ° °Activity: Do not lift > 10 lbs for 6 weeks (do not lift anything heavier than your baby). °No intercourse, tampons, swimming pools, hot tubs, baths (only showers) for 6 weeks.  °No driving for 1-2 weeks. °Continue prenatal vitamin, especially if breastfeeding. °Increase calories and fluids (water) while breastfeeding.  ° °Your milk will come in, in the next couple of days (right now it is colostrum). You may have a slight fever when your milk comes in, but it should go away on its own.  If it does not, and rises above 101 F please call the doctor. You will also feel achy and your breasts will be firm. They will also start to leak. If you  are breastfeeding, continue as you have been and you can pump/express milk for comfort.  ° °If you have too much milk, your breasts can become engorged, which could lead to mastitis. This is an infection of the milk ducts. It can be very painful and you will need to notify your doctor to obtain a prescription for antibiotics. You can also treat it with a shower or hot/cold compress.  ° °For concerns about your baby, please call your pediatrician.  °For breastfeeding concerns, the lactation consultant can be reached at [phone].  ° °Postpartum blues (feelings of happy one minute and sad another minute) are normal for the first few weeks but if it gets worse let your doctor know.  ° °Congratulations! We enjoyed caring for you and your new bundle of joy!  °

## 2022-11-05 NOTE — Discharge Summary (Signed)
Postpartum Discharge Summary     Patient Name: Amanda Foley DOB: 23-Aug-1985 MRN: [id number]  Date of admission: 11/03/2022 Delivery date:11/04/2022 Delivering provider: Christeen Douglas Date of discharge: 11/05/2022  Admitting diagnosis: [redacted] weeks gestation of pregnancy [O48.0, Z3A.41] Intrauterine pregnancy: [redacted]w[redacted]d     Secondary diagnosis:  Principal Problem:   [redacted] weeks gestation of pregnancy  Additional problems: none    Discharge diagnosis: Term Pregnancy Delivered                                              Post partum procedures: none Augmentation: N/A Complications: None  Hospital course: Onset of Labor With Unplanned C/S   37 y.o. yo G3P3003 at [redacted]w[redacted]d was admitted in Active Labor on 11/03/2022. Patient had a labor course significant for attempted [facility], pushed for 2 hours prior to hospital admission. The patient went for cesarean section due to Arrest of Descent and Non-Reassuring FHR. Delivery details as follows: Membrane Rupture Time/Date: 8:00 PM,11/03/2022  Delivery Method:C-Section, Low Transverse Operative Delivery:N/A Details of operation can be found in separate operative note. Patient had a postpartum course complicated by none.  She is ambulating,tolerating a regular diet, passing flatus, and urinating well.  Patient is discharged home in stable condition 11/05/22.  Newborn Data: Birth date:11/04/2022 Birth time:2:24 AM Gender:Female Living status:Living Apgars:8 ,9  Weight:4480 g  Magnesium Sulfate received: No BMZ received: No Rhophylac:N/A MMR:N/A T-DaP: declined Flu: No RSV Vaccine received: No Transfusion:No Immunizations administered: There is no immunization history for the selected administration types on file for this patient.  Physical exam  Vitals:   11/04/22 1915 11/04/22 2340 11/05/22 0517 11/05/22 0828  BP: 127/78 133/84 (!) 126/91 130/89  Pulse: 89 (!) 102 (!) 105 91  Resp: 18 20 18 20   Temp: 98.2 F (36.8 C) 98.6 F (37 C) 98.9 F  (37.2 C) 98 F (36.7 C)  TempSrc: Oral Oral Oral Oral  SpO2: 98% 99% 99% 99%  Weight:      Height:       General: alert, cooperative, and no distress Lochia: appropriate Uterine Fundus: firm Incision: Dressing is clean, dry, and intact DVT Evaluation: No evidence of DVT seen on physical exam. Labs: Lab Results  Component Value Date   WBC 18.3 (H) 11/04/2022   HGB 10.1 (L) 11/04/2022   HCT 30.3 (L) 11/04/2022   MCV 81.9 11/04/2022   PLT 231 11/04/2022      Latest Ref Rng & Units 11/04/2022    5:36 AM  CMP  Creatinine 0.44 - 1.00 mg/dL 6.21    Edinburgh Score:    01/02/2019   10:00 AM  Edinburgh Postnatal Depression Scale Screening Tool  I have been able to laugh and see the funny side of things. 0  I have looked forward with enjoyment to things. 0  I have blamed myself unnecessarily when things went wrong. 1  I have been anxious or worried for no good reason. 1  I have felt scared or panicky for no good reason. 0  Things have been getting on top of me. 1  I have been so unhappy that I have had difficulty sleeping. 0  I have felt sad or miserable. 0  I have been so unhappy that I have been crying. 0  The thought of harming myself has occurred to me. 0  Edinburgh Postnatal Depression Scale Total 3  After visit meds:  Allergies as of 11/05/2022   No Active Allergies      Medication List     STOP taking these medications    Accu-Chek Guide test strip Generic drug: glucose blood   Accu-Chek Guide w/Device Kit   Accu-Chek Softclix Lancets lancets       TAKE these medications    acetaminophen 500 MG tablet Commonly known as: TYLENOL Take 2 tablets (1,000 mg total) by mouth every 6 (six) hours.   ferrous sulfate 325 (65 FE) MG tablet Take 1 tablet (325 mg total) by mouth 2 (two) times daily with a meal.   ibuprofen 600 MG tablet Commonly known as: ADVIL Take 1 tablet (600 mg total) by mouth every 6 (six) hours.   oxyCODONE 5 MG immediate  release tablet Commonly known as: Oxy IR/ROXICODONE Take 1 tablet (5 mg total) by mouth every 6 (six) hours as needed for severe pain.   prenatal multivitamin Tabs tablet Take 1 tablet by mouth daily at 12 noon.   senna-docusate 8.6-50 MG tablet Commonly known as: Senokot-S Take 2 tablets by mouth daily. Start taking on: November 06, 2022   simethicone 80 MG chewable tablet Commonly known as: MYLICON Chew 1 tablet (80 mg total) by mouth as needed for flatulence.               Discharge Care Instructions  (From admission, onward)           Start     Ordered   11/05/22 0000  Leave dressing on - Keep it clean, dry, and intact until clinic visit        11/05/22 1016             Discharge home in stable condition Infant Feeding: Breast Infant Disposition:home with mother Discharge instruction: per After Visit Summary and Postpartum booklet. Activity: Advance as tolerated. Pelvic rest for 6 weeks.  Diet: routine diet Anticipated Birth Control:  NFP Postpartum Appointment:6 weeks Additional Postpartum F/U: Incision check 1 week-desires to schedule with Dr. Dalbert Garnet at [facility] Future Appointments:No future appointments. Follow up Visit:  Follow-up Information     Christeen Douglas, MD Follow up in 2 week(s).   Specialty: Obstetrics and Gynecology Why: For postop check - or with Gerald Dexter, her primary provider Contact information: [address] [place] [address] [phone]                     11/05/2022 Dominica Severin, CNM

## 2022-11-05 NOTE — Final Progress Note (Signed)
Obstetric Postpartum/PostOperative Daily Progress Note Subjective:  37 y.o. G3P3003 post-operative day # 1 status post repeat cesarean delivery.  She is ambulating, is tolerating po, is voiding spontaneously.  Her pain is well controlled on PO pain medications. Her lochia is less than menses.   Medications SCHEDULED MEDICATIONS   acetaminophen  1,000 mg Oral Q6H   enoxaparin (LOVENOX) injection  40 mg Subcutaneous Q24H   ferrous sulfate  325 mg Oral BID WC   gabapentin  300 mg Oral QHS   ibuprofen  600 mg Oral Q6H   measles, mumps & rubella vaccine  0.5 mL Subcutaneous Once   prenatal multivitamin  1 tablet Oral Q1200   scopolamine  1 patch Transdermal Once   senna-docusate  2 tablet Oral Q24H   simethicone  80 mg Oral TID PC   Tdap  0.5 mL Intramuscular Once    MEDICATION INFUSIONS   lactated ringers     naloxone HCl (NARCAN) 2 mg in dextrose 5 % 250 mL infusion      PRN MEDICATIONS  bisacodyl, coconut oil, witch hazel-glycerin **AND** dibucaine, diphenhydrAMINE **OR** diphenhydrAMINE, menthol-cetylpyridinium, naloxone **AND** sodium chloride flush, naloxone HCl (NARCAN) 2 mg in dextrose 5 % 250 mL infusion, ondansetron (ZOFRAN) IV, oxyCODONE, oxyCODONE, simethicone, sodium phosphate    Objective:   Vitals:   11/04/22 1915 11/04/22 2340 11/05/22 0517 11/05/22 0828  BP: 127/78 133/84 (!) 126/91 130/89  Pulse: 89 (!) 102 (!) 105 91  Resp: 18 20 18 20   Temp: 98.2 F (36.8 C) 98.6 F (37 C) 98.9 F (37.2 C) 98 F (36.7 C)  TempSrc: Oral Oral Oral Oral  SpO2: 98% 99% 99% 99%  Weight:      Height:        Current Vital Signs 24h Vital Sign Ranges  T 98 F (36.7 C) Temp  Avg: 98.2 F (36.8 C)  Min: 97.7 F (36.5 C)  Max: 98.9 F (37.2 C)  BP 130/89 BP  Min: 117/69  Max: 133/84  HR 91 Pulse  Avg: 94.3  Min: 88  Max: 105  RR 20 Resp  Avg: 18.8  Min: 18  Max: 20  SaO2 99 % Room Air SpO2  Avg: 98.8 %  Min: 98 %  Max: 99 %       24 Hour I/O Current Shift I/O   Time Ins Outs 09/27 0701 - 09/28 0700 In: 120 [P.O.:120] Out: 550 [Urine:550] No intake/output data recorded.  General: NAD Pulmonary: no increased work of breathing Abdomen: non-distended, non-tender, fundus firm at level of umbilicus Inc: Clean/dry/intact Extremities: no edema, no erythema, no tenderness  Labs:  Recent Labs  Lab 11/02/22 1658 11/03/22 2133 11/04/22 0536  WBC 8.9 16.7* 18.3*  HGB 12.1 12.6 10.1*  HCT 37.9 38.9 30.3*  PLT 257 265 231     Assessment:   36 y.o. G3P3003 postoperative day # 1 status post repeat cesarean section  Plan:  1) Acute blood loss anemia - hemodynamically stable and asymptomatic - po ferrous sulfate  2) O POS / Rubella 2.58 (09/25 1653)/ Varicella Immune  3) TDAP status declined  4) breast /Contraception =  NFP  5) Disposition: desires discharge home today  Dominica Severin, CNM

## 2022-11-05 NOTE — Lactation Note (Signed)
This note was copied from a baby's chart. Lactation Consultation Note  Patient Name: Amanda Foley Date: 11/05/2022 Age:37 hours Reason for consult: Follow-up assessment;Term;Maternal discharge   Maternal Data Has patient been taught Hand Expression?: Yes Does the patient have breastfeeding experience prior to this delivery?: Yes How long did the patient breastfeed?: 3 yrs  Feeding Mother's Current Feeding Choice: Breast Milk Mom able to latch baby without assist, tender nipples which mom is using silver shields over nipples when not  breastfeeding and these work for her, baby has tongue and lip tie and mom plans on having baby seen for this post d/c   LATCH Score Latch: Grasps breast easily, tongue down, lips flanged, rhythmical sucking.  Audible Swallowing: Spontaneous and intermittent  Type of Nipple: Everted at rest and after stimulation  Comfort (Breast/Nipple): Filling, red/small blisters or bruises, mild/mod discomfort  Hold (Positioning): No assistance needed to correctly position infant at breast.  LATCH Score: 9   Lactation Tools Discussed/Used    Interventions  LC name updated on white board  Discharge Pump: Personal  Consult Status Consult Status: PRN Date: 11/05/22 Follow-up type: In-patient    Dyann Kief 11/05/2022, 10:51 AM

## 2022-12-01 ENCOUNTER — Telehealth: Payer: Self-pay | Admitting: Family

## 2022-12-01 NOTE — Telephone Encounter (Addendum)
The patient has contacted our office with questions and concerns about her delivery. The patient reports she scheduled post op with Dr. Dalbert Garnet and was told she couldn't see her.the patient states she is healing great. 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 She is scheduled for 6 week PP with Jessica A on 12/16/22. However, she states during her c-section something rare took place and would like to speak with delivery provider Leta Jungling. Please contact patient and per patient ok to leave voicemail with contact information if possible.

## 2022-12-06 NOTE — Telephone Encounter (Signed)
I spoke with the patient and she is aware Amanda Foley is not on call. She expressed her gratitude for being updated.

## 2022-12-06 NOTE — Telephone Encounter (Signed)
I contacted the patient to follow up to let her know Warnell Bureau is not on call from 10/28 thru 10/31. Just to let her know so she knows we are going to follow up with her concerns. I had to leave generic message for the patient to contact the office to update her on this status.

## 2022-12-16 ENCOUNTER — Other Ambulatory Visit (HOSPITAL_COMMUNITY)
Admission: RE | Admit: 2022-12-16 | Discharge: 2022-12-16 | Disposition: A | Discharge status: Home / Self Care | Payer: Self-pay | Source: Ambulatory Visit | Attending: Certified Nurse Midwife | Admitting: Certified Nurse Midwife

## 2022-12-16 ENCOUNTER — Ambulatory Visit (INDEPENDENT_AMBULATORY_CARE_PROVIDER_SITE_OTHER): Payer: Self-pay | Admitting: Certified Nurse Midwife

## 2022-12-16 ENCOUNTER — Encounter: Payer: Self-pay | Admitting: Certified Nurse Midwife

## 2022-12-16 DIAGNOSIS — Z124 Encounter for screening for malignant neoplasm of cervix: Secondary | ICD-10-CM

## 2022-12-16 DIAGNOSIS — Z131 Encounter for screening for diabetes mellitus: Secondary | ICD-10-CM

## 2022-12-16 DIAGNOSIS — Z1151 Encounter for screening for human papillomavirus (HPV): Secondary | ICD-10-CM | POA: Insufficient documentation

## 2022-12-16 DIAGNOSIS — Z1329 Encounter for screening for other suspected endocrine disorder: Secondary | ICD-10-CM

## 2022-12-16 NOTE — Progress Notes (Signed)
Post Partum Visit Note  Amanda Foley is a 37 y.o. G39P3003 female who presents for a postpartum visit. She is 6 weeks postpartum following a repeat cesarean section.  I have fully reviewed the prenatal and intrapartum course. The delivery was at 42 gestational weeks.  Anesthesia: epidural. Postpartum course has been uncomplicated. Baby boy Clydene Pugh is doing well. Baby is feeding by breast. Bleeding  is spotting at times . Bowel function is normal. Bladder function is normal. Patient is not sexually active. Contraception method is condoms and FAM . Postpartum depression screening: negative.   Upstream - 12/16/22 1218       Pregnancy Intention Screening   Does the patient want to become pregnant in the next year? No    Does the patient's partner want to become pregnant in the next year? No    Would the patient like to discuss contraceptive options today? No      Contraception Wrap Up   Current Method Female Condom;FAM or LAM    End Method FAM or LAM;Female Condom    Contraception Counseling Provided No    How was the end contraceptive method provided? N/A            The pregnancy intention screening data noted above was reviewed. Potential methods of contraception were discussed. The patient elected to proceed with FAM or LAM; Female Condom.   Edinburgh Postnatal Depression Scale - 12/16/22 1112       Edinburgh Postnatal Depression Scale:  In the Past 7 Days   I have been able to laugh and see the funny side of things. 0    I have looked forward with enjoyment to things. 0    I have blamed myself unnecessarily when things went wrong. 2    I have been anxious or worried for no good reason. 2    I have felt scared or panicky for no good reason. 0    Things have been getting on top of me. 1    I have been so unhappy that I have had difficulty sleeping. 0    I have felt sad or miserable. 1    I have been so unhappy that I have been crying. 1    The thought of harming myself has occurred  to me. 0    Edinburgh Postnatal Depression Scale Total 7             Health Maintenance Due  Topic Date Due   DTaP/Tdap/Td (1 - Tdap) Never done   Cervical Cancer Screening (HPV/Pap Cotest)  Never done   INFLUENZA VACCINE  Never done   COVID-19 Vaccine (1 - 2023-24 season) Never done    The following portions of the patient's history were reviewed and updated as appropriate: allergies, current medications, past family history, past medical history, past social history, past surgical history, and problem list.  Review of Systems Pertinent items are noted in HPI.  Objective:  BP 120/87   Pulse 85   Ht 5\' 1"  (1.549 m)   Wt 221 lb (100.2 kg)   LMP  (LMP Unknown)   Breastfeeding Yes   BMI 41.76 kg/m    General:  alert, cooperative, appears stated age, and no distress   Breasts:  normal  Lungs: clear to auscultation bilaterally  Heart:  regular rate and rhythm, S1, S2 normal, no murmur, click, rub or gallop  Abdomen: Soft, nontender, DR 3FB    Wound Incision site well healed  GU exam:  normal  Assessment:   1. Routine postpartum follow-up  2. Postpartum care and examination of lactating mother  3. Encounter for screening for human papillomavirus (HPV) - Cytology - PAP  4. Cervical cancer screening - Cytology - PAP  5. Screening for thyroid disorder - TSH+T4F+T3Free+ThyAbs+TPO+VD25  6. Screening for diabetes mellitus - HgB A1c     Normal postpartum exam.   Plan:   Essential components of care per ACOG recommendations:  1.  Mood and well being: Patient with negative depression screening today.  2. Infant care and feeding:  -Patient currently breastmilk feeding? Yes, lactation resources provided to assist with weaning off nipple shield, infant with revised oral ties  -Social determinants of health (SDOH) reviewed in EPIC. No concerns  3. Sexuality, contraception and birth spacing - Patient does not want a pregnancy in the next year.  Desired  family size is 3-4 children.  - Reviewed reproductive life planning. Reviewed contraceptive methods based on pt preferences and effectiveness.  Patient desired FAM or LAM and Female Condom today.   - Discussed birth spacing of 18-24 months  4. Sleep and fatigue -Encouraged family/partner/community support of 4 hrs of uninterrupted sleep to help with mood and fatigue  5. Physical Recovery  - Discussed patients delivery and complications. She describes her labor as mixed. - Patient had a C-section repeat; no problems after delivery - Patient has urinary incontinence? No. - Patient is safe to resume physical and sexual activity  6.  Health Maintenance - HM due items addressed Yes - Last pap smear No results found for: "DIAGPAP" Pap smear done at today's visit.  -Breast Cancer screening indicated? No.   7. Chronic Disease/Pregnancy Condition follow up: None  - PCP follow up  Dominica Severin, CNM [facility], [facility]

## 2022-12-17 LAB — HEMOGLOBIN A1C
Est. average glucose Bld gHb Est-mCnc: 105 mg/dL
Hgb A1c MFr Bld: 5.3 % (ref 4.8–5.6)

## 2022-12-17 LAB — TSH+T4F+T3FREE+THYABS+TPO+VD25
Free T4: 1.07 ng/dL (ref 0.82–1.77)
T3, Free: 2.6 pg/mL (ref 2.0–4.4)
TSH: 1.21 u[IU]/mL (ref 0.450–4.500)
Thyroglobulin Antibody: <1 [IU]/mL (ref 0.0–0.9)
Thyroperoxidase Ab SerPl-aCnc: 15 [IU]/mL (ref 0–34)
Vit D, 25-Hydroxy: 42 ng/mL (ref 30.0–100.0)

## 2022-12-20 LAB — CYTOLOGY - PAP
Comment: NEGATIVE
Diagnosis: NEGATIVE
High risk HPV: NEGATIVE
